# Patient Record
Sex: Female | Born: 1990 | Race: Black or African American | Hispanic: No | Marital: Single | Smoking: Never smoker
Health system: Southern US, Community
[De-identification: ages and names within clinical notes are randomized; demographics above are authoritative.]

## PROBLEM LIST (undated history)

## (undated) ENCOUNTER — Inpatient Hospital Stay (HOSPITAL_COMMUNITY): Payer: Self-pay

## (undated) DIAGNOSIS — R87629 Unspecified abnormal cytological findings in specimens from vagina: Secondary | ICD-10-CM

## (undated) DIAGNOSIS — J45909 Unspecified asthma, uncomplicated: Secondary | ICD-10-CM

## (undated) DIAGNOSIS — L309 Dermatitis, unspecified: Secondary | ICD-10-CM

## (undated) DIAGNOSIS — F419 Anxiety disorder, unspecified: Secondary | ICD-10-CM

## (undated) DIAGNOSIS — Z789 Other specified health status: Secondary | ICD-10-CM

## (undated) HISTORY — DX: Dermatitis, unspecified: L30.9

## (undated) HISTORY — PX: MOLE REMOVAL: SHX2046

## (undated) HISTORY — PX: HERNIA REPAIR: SHX51

## (undated) HISTORY — DX: Anxiety disorder, unspecified: F41.9

## (undated) HISTORY — DX: Unspecified abnormal cytological findings in specimens from vagina: R87.629

---
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
## 2011-06-30 ENCOUNTER — Inpatient Hospital Stay (INDEPENDENT_AMBULATORY_CARE_PROVIDER_SITE_OTHER)
Admission: RE | Admit: 2011-06-30 | Discharge: 2011-06-30 | Disposition: A | Discharge status: Home / Self Care | Payer: Self-pay | Source: Ambulatory Visit | Attending: Emergency Medicine | Admitting: Emergency Medicine

## 2011-06-30 DIAGNOSIS — H60399 Other infective otitis externa, unspecified ear: Secondary | ICD-10-CM

## 2011-06-30 DIAGNOSIS — J069 Acute upper respiratory infection, unspecified: Secondary | ICD-10-CM

## 2011-07-12 LAB — URINALYSIS, ROUTINE W REFLEX MICROSCOPIC
Bilirubin Urine: NEGATIVE
Glucose, UA: NEGATIVE
Ketones, ur: NEGATIVE
Nitrite: NEGATIVE
Protein, ur: NEGATIVE
Specific Gravity, Urine: 1.007
Urobilinogen, UA: 1
pH: 7.5

## 2011-07-12 LAB — URINE MICROSCOPIC-ADD ON

## 2011-07-12 LAB — POCT PREGNANCY, URINE: Preg Test, Ur: NEGATIVE

## 2012-12-02 LAB — OB RESULTS CONSOLE GBS: STREP GROUP B AG: NEGATIVE

## 2013-06-06 ENCOUNTER — Emergency Department (HOSPITAL_COMMUNITY)
Admission: EM | Admit: 2013-06-06 | Discharge: 2013-06-06 | Disposition: A | Discharge status: Home / Self Care | Payer: Medicaid Other | Attending: Emergency Medicine | Admitting: Emergency Medicine

## 2013-06-06 ENCOUNTER — Emergency Department (HOSPITAL_COMMUNITY): Payer: Medicaid Other

## 2013-06-06 ENCOUNTER — Encounter (HOSPITAL_COMMUNITY): Payer: Self-pay | Admitting: Emergency Medicine

## 2013-06-06 DIAGNOSIS — O2390 Unspecified genitourinary tract infection in pregnancy, unspecified trimester: Secondary | ICD-10-CM | POA: Insufficient documentation

## 2013-06-06 DIAGNOSIS — Z3490 Encounter for supervision of normal pregnancy, unspecified, unspecified trimester: Secondary | ICD-10-CM

## 2013-06-06 DIAGNOSIS — O9989 Other specified diseases and conditions complicating pregnancy, childbirth and the puerperium: Secondary | ICD-10-CM | POA: Insufficient documentation

## 2013-06-06 DIAGNOSIS — E876 Hypokalemia: Secondary | ICD-10-CM | POA: Insufficient documentation

## 2013-06-06 DIAGNOSIS — R110 Nausea: Secondary | ICD-10-CM | POA: Insufficient documentation

## 2013-06-06 DIAGNOSIS — O2341 Unspecified infection of urinary tract in pregnancy, first trimester: Secondary | ICD-10-CM

## 2013-06-06 DIAGNOSIS — R109 Unspecified abdominal pain: Secondary | ICD-10-CM | POA: Insufficient documentation

## 2013-06-06 DIAGNOSIS — M545 Low back pain, unspecified: Secondary | ICD-10-CM | POA: Insufficient documentation

## 2013-06-06 DIAGNOSIS — N390 Urinary tract infection, site not specified: Secondary | ICD-10-CM | POA: Insufficient documentation

## 2013-06-06 LAB — CBC WITH DIFFERENTIAL/PLATELET
Basophils Relative: 0 % (ref 0–1)
Eosinophils Absolute: 0.1 10*3/uL (ref 0.0–0.7)
Eosinophils Relative: 1 % (ref 0–5)
Lymphs Abs: 1.7 10*3/uL (ref 0.7–4.0)
MCH: 30.3 pg (ref 26.0–34.0)
MCHC: 35.1 g/dL (ref 30.0–36.0)
MCV: 86.4 fL (ref 78.0–100.0)
Neutrophils Relative %: 73 % (ref 43–77)
Platelets: 254 10*3/uL (ref 150–400)
RBC: 4.03 MIL/uL (ref 3.87–5.11)

## 2013-06-06 LAB — COMPREHENSIVE METABOLIC PANEL
Albumin: 3.5 g/dL (ref 3.5–5.2)
BUN: 6 mg/dL (ref 6–23)
Calcium: 9 mg/dL (ref 8.4–10.5)
GFR calc Af Amer: >90 mL/min (ref >90)
Glucose, Bld: 85 mg/dL (ref 70–99)
Potassium: 3 mEq/L — ABNORMAL LOW (ref 3.5–5.1)
Sodium: 134 mEq/L — ABNORMAL LOW (ref 135–145)
Total Protein: 7.3 g/dL (ref 6.0–8.3)

## 2013-06-06 LAB — URINE MICROSCOPIC-ADD ON

## 2013-06-06 LAB — URINALYSIS, ROUTINE W REFLEX MICROSCOPIC
Nitrite: NEGATIVE
Specific Gravity, Urine: 1.027 (ref 1.005–1.030)
Urobilinogen, UA: 1 mg/dL (ref 0.0–1.0)
pH: 7 (ref 5.0–8.0)

## 2013-06-06 LAB — LIPASE, BLOOD: Lipase: 38 U/L (ref 11–59)

## 2013-06-06 LAB — POCT PREGNANCY, URINE: Preg Test, Ur: POSITIVE — AB

## 2013-06-06 MED ORDER — NITROFURANTOIN MONOHYD MACRO 100 MG PO CAPS: 100.0000 mg | ORAL_CAPSULE | Freq: Two times a day (BID) | ORAL | Status: DC

## 2013-06-06 MED ORDER — POTASSIUM CHLORIDE CRYS ER 20 MEQ PO TBCR
40.0000 meq | EXTENDED_RELEASE_TABLET | Freq: Once | ORAL | Status: AC
  Administered 2013-06-06: 40 meq via ORAL
  Filled 2013-06-06: qty 2

## 2013-06-06 MED ORDER — ONDANSETRON 4 MG PO TBDP
4.0000 mg | ORAL_TABLET | Freq: Once | ORAL | Status: AC
  Administered 2013-06-06: 4 mg via ORAL
  Filled 2013-06-06: qty 1

## 2013-06-06 NOTE — ED Notes (Signed)
Pt alert and oriented x4. Respirations even and unlabored, bilateral symmetrical rise and fall of chest. Skin warm and dry. In no acute distress. Denies needs.   

## 2013-06-06 NOTE — ED Notes (Signed)
Pt to ultrasound

## 2013-06-06 NOTE — ED Notes (Signed)
Pt back from ultrasound.

## 2013-06-06 NOTE — ED Notes (Signed)
Pt escorted to discharge window. Pt verbalized understanding discharge instructions. In no acute distress.  

## 2013-06-06 NOTE — ED Provider Notes (Signed)
Medical screening examination/treatment/procedure(s) were performed by non-physician practitioner and as supervising physician I was immediately available for consultation/collaboration.   Loren Racer, MD 06/06/13 [phone]

## 2013-06-06 NOTE — ED Notes (Signed)
Pt states that she is having low abd, back pain since today at 8 am.  Pt states that she is nauseous but denies vomiting or diarrhea.

## 2013-06-06 NOTE — ED Provider Notes (Signed)
CSN: [id number]     Arrival date & time 06/06/13  1055 History     First MD Initiated Contact with Patient 06/06/13 1103     Chief Complaint  Patient presents with  . Abdominal Pain   (Consider location/radiation/quality/duration/timing/severity/associated sxs/prior Treatment) HPI Comments: Pt c/o generalized abdominal pain and lower back pain since this morning:nausea without vomiting or diarrhea:denies fever:no dysuria, has some vaginal discharge:has not taken an medication:no history of similar symptoms:last period was about 1 month ago:no previous pregnancy  The history is provided by the patient. No language interpreter was used.    History reviewed. No pertinent past medical history. History reviewed. No pertinent past surgical history. History reviewed. No pertinent family history. History  Substance Use Topics  . Smoking status: Never Smoker   . Smokeless tobacco: Not on file  . Alcohol Use: No   OB History   Grav Para Term Preterm Abortions TAB SAB Ect Mult Living                 Review of Systems  Constitutional: Negative.   Respiratory: Negative.   Cardiovascular: Negative.     Allergies  Review of patient's allergies indicates no known allergies.  Home Medications  No current outpatient prescriptions on file. BP 119/72  Pulse 95  Temp(Src) 98.3 F (36.8 C) (Oral)  Resp 16  SpO2 100%  LMP 04/22/2013 Physical Exam  Nursing note and vitals reviewed. Constitutional: She is oriented to person, place, and time. She appears well-developed and well-nourished.  HENT:  Head: Normocephalic and atraumatic.  Eyes: Conjunctivae and EOM are normal. Pupils are equal, round, and reactive to light.  Neck: Normal range of motion. Neck supple.  Cardiovascular: Normal rate and regular rhythm.   Pulmonary/Chest: Effort normal and breath sounds normal.  Abdominal: Soft. Bowel sounds are normal.  Generalized tenderness  Genitourinary:  Thick white discharge   Musculoskeletal: Normal range of motion.  Neurological: She is alert and oriented to person, place, and time.  Skin: Skin is warm and dry.  Psychiatric: She has a normal mood and affect.    ED Course   Procedures (including critical care time)  Labs Reviewed  WET PREP, GENITAL - Abnormal; Notable for the following:    Clue Cells Wet Prep HPF POC FEW (*)    WBC, Wet Prep HPF POC FEW (*)    All other components within normal limits  CBC WITH DIFFERENTIAL - Abnormal; Notable for the following:    HCT 34.8 (*)    All other components within normal limits  COMPREHENSIVE METABOLIC PANEL - Abnormal; Notable for the following:    Sodium 134 (*)    Potassium 3.0 (*)    Alkaline Phosphatase 34 (*)    All other components within normal limits  URINALYSIS, ROUTINE W REFLEX MICROSCOPIC - Abnormal; Notable for the following:    APPearance CLOUDY (*)    Leukocytes, UA MODERATE (*)    All other components within normal limits  HCG, QUANTITATIVE, PREGNANCY - Abnormal; Notable for the following:    hCG, Beta Chain, Quant, [place] [address] (*)    All other components within normal limits  POCT PREGNANCY, URINE - Abnormal; Notable for the following:    Preg Test, Ur POSITIVE (*)    All other components within normal limits  GC/CHLAMYDIA PROBE AMP  URINE CULTURE  LIPASE, BLOOD  URINE MICROSCOPIC-ADD ON   US Ob Comp Less 14 Wks  06/06/2013   *RADIOLOGY REPORT*  Clinical Data: Pregnant, abdominal pain, pelvic cramping;  no quantitative beta HCG available for comparison  OBSTETRIC <14 WK [place] AND TRANSVAGINAL OB US  Technique:  Both transabdominal and transvaginal ultrasound examinations were performed for complete evaluation of the gestation as well as the maternal uterus, adnexal regions, and pelvic cul-de-sac.  Transvaginal technique was performed to assess early pregnancy.  Comparison:  None  Intrauterine gestational sac:  Visualized/normal in shape. Yolk sac: Present Embryo: Present Cardiac Activity:  Present Heart Rate: 158  bpm  CRL: 29.8  mm  9 w  6 d             [place] EDC: 01/03/2014  Maternal uterus/adnexae: Small subchorionic hemorrhage 10 x 8 x 8 mm. Left ovary normal size and morphology, 1.9 x 3.3 x 1.8 cm. Right ovary measures 2.5 x 3.4 x 2.6 cm in size and contains a corpus luteal cyst measuring 1.8 x 1.3 x 1.3 cm. No additional adnexal masses. Trace free pelvic fluid.  IMPRESSION: Single live early intrauterine gestation measured at 9 weeks 6 days EGA. Small subchorionic hemorrhage 10 x 8 x 8 mm.   Original Report Authenticated By: Ulyses Southward, M.D.   US Ob Transvaginal  06/06/2013   *RADIOLOGY REPORT*  Clinical Data: Pregnant, abdominal pain, pelvic cramping; no quantitative beta HCG available for comparison  OBSTETRIC <14 WK [place] AND TRANSVAGINAL OB US  Technique:  Both transabdominal and transvaginal ultrasound examinations were performed for complete evaluation of the gestation as well as the maternal uterus, adnexal regions, and pelvic cul-de-sac.  Transvaginal technique was performed to assess early pregnancy.  Comparison:  None  Intrauterine gestational sac:  Visualized/normal in shape. Yolk sac: Present Embryo: Present Cardiac Activity: Present Heart Rate: 158  bpm  CRL: 29.8  mm  9 w  6 d             [place] EDC: 01/03/2014  Maternal uterus/adnexae: Small subchorionic hemorrhage 10 x 8 x 8 mm. Left ovary normal size and morphology, 1.9 x 3.3 x 1.8 cm. Right ovary measures 2.5 x 3.4 x 2.6 cm in size and contains a corpus luteal cyst measuring 1.8 x 1.3 x 1.3 cm. No additional adnexal masses. Trace free pelvic fluid.  IMPRESSION: Single live early intrauterine gestation measured at 9 weeks 6 days EGA. Small subchorionic hemorrhage 10 x 8 x 8 mm.   Original Report Authenticated By: Ulyses Southward, M.D.   1. Pregnancy   2. Hypokalemia   3. UTI (urinary tract infection) in pregnancy in first trimester     MDM  Discussed finding with pt:pt is comfortable at this time:will treat for uti:pt instructed on  uti follow up as well as prenatal care  Teressa Lower, NP 06/06/13 1427  Teressa Lower, NP 06/06/13 1427

## 2013-06-07 LAB — GC/CHLAMYDIA PROBE AMP
CT Probe RNA: POSITIVE — AB
GC Probe RNA: NEGATIVE

## 2013-06-08 LAB — URINE CULTURE

## 2013-06-08 NOTE — ED Notes (Signed)
+  Chlamydia Chart sent to EDP office for review.  

## 2013-07-12 ENCOUNTER — Other Ambulatory Visit (HOSPITAL_COMMUNITY): Payer: Self-pay | Admitting: Nurse Practitioner

## 2013-07-12 DIAGNOSIS — Z3689 Encounter for other specified antenatal screening: Secondary | ICD-10-CM

## 2013-08-01 ENCOUNTER — Encounter (HOSPITAL_COMMUNITY): Payer: Self-pay | Admitting: *Deleted

## 2013-08-01 ENCOUNTER — Inpatient Hospital Stay (HOSPITAL_COMMUNITY)
Admission: AD | Admit: 2013-08-01 | Discharge: 2013-08-01 | Disposition: A | Discharge status: Home / Self Care | Payer: Medicaid Other | Source: Ambulatory Visit | Attending: Obstetrics and Gynecology | Admitting: Obstetrics and Gynecology

## 2013-08-01 DIAGNOSIS — B9689 Other specified bacterial agents as the cause of diseases classified elsewhere: Secondary | ICD-10-CM | POA: Insufficient documentation

## 2013-08-01 DIAGNOSIS — O26899 Other specified pregnancy related conditions, unspecified trimester: Secondary | ICD-10-CM

## 2013-08-01 DIAGNOSIS — N949 Unspecified condition associated with female genital organs and menstrual cycle: Secondary | ICD-10-CM | POA: Insufficient documentation

## 2013-08-01 DIAGNOSIS — A499 Bacterial infection, unspecified: Secondary | ICD-10-CM | POA: Insufficient documentation

## 2013-08-01 DIAGNOSIS — O2390 Unspecified genitourinary tract infection in pregnancy, unspecified trimester: Secondary | ICD-10-CM | POA: Insufficient documentation

## 2013-08-01 DIAGNOSIS — N760 Acute vaginitis: Secondary | ICD-10-CM | POA: Insufficient documentation

## 2013-08-01 DIAGNOSIS — R109 Unspecified abdominal pain: Secondary | ICD-10-CM

## 2013-08-01 LAB — URINALYSIS, ROUTINE W REFLEX MICROSCOPIC
Glucose, UA: NEGATIVE mg/dL
Leukocytes, UA: NEGATIVE
Protein, ur: NEGATIVE mg/dL
Specific Gravity, Urine: >1.03 — ABNORMAL HIGH (ref 1.005–1.030)
pH: 6 (ref 5.0–8.0)

## 2013-08-01 LAB — WET PREP, GENITAL: Trich, Wet Prep: NONE SEEN

## 2013-08-01 LAB — URINE MICROSCOPIC-ADD ON

## 2013-08-01 MED ORDER — METRONIDAZOLE 500 MG PO TABS: 500.0000 mg | ORAL_TABLET | Freq: Two times a day (BID) | ORAL | Status: DC

## 2013-08-01 NOTE — MAU Note (Signed)
Pt presents with complaints of pain in her right and left lower side. States the pain started last night and she says it has gotten worse throughout the day. Denies any bleeding or leakage of fluid

## 2013-08-01 NOTE — MAU Provider Note (Signed)
History     CSN: [id number]  Arrival date and time: 08/01/13 1259   First Provider Initiated Contact with Patient 08/01/13 1331      Chief Complaint  Patient presents with  . Abdominal Pain   HPI Pt is G1P0 18w pregnant and presents with bilateral lower abdominal pain radiating upwards and to her sides. The pain is worse when she sits up but not when she moves sideways. Pt denies constipation or diarrhea or UTI symptoms.  [facility] Pt states she still has had a vaginal discharge.  She has not had IC since she was treated for chlamydia.  Pt has been seen at [facility] for her OB care but has an appointment with Dr. Gaynell Face on 10/22 for her OB care. RN note:  Registered Nurse Signed MAU Note Service date: 08/01/2013 1:21 PM   Pt presents with complaints of pain in her right and left lower side. States the pain started last night and she says it has gotten worse throughout the day. Denies any bleeding or leakage of fluid     History reviewed. No pertinent past medical history.  Past Surgical History  Procedure Laterality Date  . Hernia repair      History reviewed. No pertinent family history.  History  Substance Use Topics  . Smoking status: Never Smoker   . Smokeless tobacco: Not on file  . Alcohol Use: No    Allergies: No Known Allergies  Prescriptions prior to admission  Medication Sig Dispense Refill  . Prenatal Vit-Fe Fumarate-FA (PRENATAL MULTIVITAMIN) TABS tablet Take 1 tablet by mouth daily at 12 noon.        Review of Systems  Constitutional: Negative for fever.  Gastrointestinal: Positive for abdominal pain. Negative for nausea, vomiting, diarrhea and constipation.  Genitourinary: Negative for dysuria and urgency.   Physical Exam   Blood pressure 103/59, pulse 92, temperature 98.5 F (36.9 C), temperature source Oral, resp. rate 18, last menstrual period 03/28/2013.  Physical Exam  Vitals  reviewed. Constitutional: She is oriented to person, place, and time. She appears well-developed and well-nourished.  HENT:  Head: Normocephalic.  Eyes: Pupils are equal, round, and reactive to light.  Neck: Normal range of motion. Neck supple.  Cardiovascular: Normal rate.   Respiratory: Effort normal.  GI: Soft. She exhibits no distension. There is no tenderness. There is no rebound and no guarding.  FHR 138 bpm audible with doppler  Genitourinary:  sm-mod amount of creamy colored frothy discharge in vault; cervix closed- bimanual uterus gravid; bimanual nontender with cervical motion- no adnexal tenderness  Musculoskeletal: Normal range of motion.  Neurological: She is alert and oriented to person, place, and time.  Skin: Skin is warm and dry.  Psychiatric: She has a normal mood and affect.    MAU Course  Procedures Results for orders placed during the hospital encounter of 08/01/13 (from the past 24 hour(s))  URINALYSIS, ROUTINE W REFLEX MICROSCOPIC     Status: Abnormal   Collection Time    08/01/13  1:00 PM      Result Value Range   Color, Urine YELLOW  YELLOW   APPearance CLEAR  CLEAR   Specific Gravity, Urine >1.030 (*) 1.005 - 1.030   pH 6.0  5.0 - 8.0   Glucose, UA NEGATIVE  NEGATIVE mg/dL   Hgb urine dipstick TRACE (*) NEGATIVE   Bilirubin Urine NEGATIVE  NEGATIVE   Ketones, ur NEGATIVE  NEGATIVE mg/dL  Protein, ur NEGATIVE  NEGATIVE mg/dL   Urobilinogen, UA 1.0  0.0 - 1.0 mg/dL   Nitrite NEGATIVE  NEGATIVE   Leukocytes, UA NEGATIVE  NEGATIVE  URINE MICROSCOPIC-ADD ON     Status: Abnormal   Collection Time    08/01/13  1:00 PM      Result Value Range   Squamous Epithelial / LPF FEW (*) RARE   WBC, UA 3-6  <3 WBC/hpf   RBC / HPF 0-2  <3 RBC/hpf   Urine-Other MUCOUS PRESENT    WET PREP, GENITAL     Status: Abnormal   Collection Time    08/01/13  2:25 PM      Result Value Range   Yeast Wet Prep HPF POC NONE SEEN  NONE SEEN   Trich, Wet Prep NONE SEEN  NONE  SEEN   Clue Cells Wet Prep HPF POC MODERATE (*) NONE SEEN   WBC, Wet Prep HPF POC MODERATE (*) NONE SEEN   GC/chlamydia   Assessment and Plan  Abdominal pain in pregnancy BV- Flagyl 500mg  BID for 7 days F/u with OB appointment  Aloura Matsuoka 08/01/2013, 1:31 PM

## 2013-08-02 LAB — GC/CHLAMYDIA PROBE AMP: CT Probe RNA: NEGATIVE

## 2013-08-02 NOTE — MAU Provider Note (Signed)
Attestation of Attending Supervision of Advanced Practitioner: Evaluation and management procedures were performed by the PA/NP/CNM/OB Fellow under my supervision/collaboration. Chart reviewed and agree with management and plan.  Ausencio Vaden V 08/02/2013 10:01 AM

## 2013-08-09 ENCOUNTER — Ambulatory Visit (HOSPITAL_COMMUNITY)
Admission: RE | Admit: 2013-08-09 | Discharge: 2013-08-09 | Disposition: A | Discharge status: Home / Self Care | Payer: Medicaid Other | Source: Ambulatory Visit | Attending: Nurse Practitioner | Admitting: Nurse Practitioner

## 2013-08-09 ENCOUNTER — Ambulatory Visit (HOSPITAL_COMMUNITY): Admission: RE | Admit: 2013-08-09 | Payer: Medicaid Other | Source: Ambulatory Visit

## 2013-08-09 DIAGNOSIS — Z3689 Encounter for other specified antenatal screening: Secondary | ICD-10-CM | POA: Insufficient documentation

## 2013-08-17 LAB — OB RESULTS CONSOLE ABO/RH: RH Type: POSITIVE

## 2013-08-17 LAB — OB RESULTS CONSOLE RPR: RPR: NONREACTIVE

## 2013-08-17 LAB — OB RESULTS CONSOLE HIV ANTIBODY (ROUTINE TESTING): HIV: NONREACTIVE

## 2013-08-17 LAB — OB RESULTS CONSOLE HEPATITIS B SURFACE ANTIGEN: HEP B S AG: NEGATIVE

## 2013-08-17 LAB — OB RESULTS CONSOLE RUBELLA ANTIBODY, IGM: Rubella: IMMUNE

## 2013-08-17 LAB — OB RESULTS CONSOLE ANTIBODY SCREEN: ANTIBODY SCREEN: NEGATIVE

## 2013-10-07 ENCOUNTER — Inpatient Hospital Stay (HOSPITAL_COMMUNITY)
Admission: AD | Admit: 2013-10-07 | Discharge: 2013-10-07 | Disposition: A | Discharge status: Home / Self Care | Payer: Medicaid Other | Source: Ambulatory Visit | Attending: Obstetrics | Admitting: Obstetrics

## 2013-10-07 ENCOUNTER — Encounter (HOSPITAL_COMMUNITY): Payer: Self-pay | Admitting: *Deleted

## 2013-10-07 DIAGNOSIS — N949 Unspecified condition associated with female genital organs and menstrual cycle: Secondary | ICD-10-CM | POA: Insufficient documentation

## 2013-10-07 DIAGNOSIS — B3731 Acute candidiasis of vulva and vagina: Secondary | ICD-10-CM | POA: Insufficient documentation

## 2013-10-07 DIAGNOSIS — R109 Unspecified abdominal pain: Secondary | ICD-10-CM | POA: Insufficient documentation

## 2013-10-07 DIAGNOSIS — B373 Candidiasis of vulva and vagina: Secondary | ICD-10-CM | POA: Insufficient documentation

## 2013-10-07 DIAGNOSIS — N898 Other specified noninflammatory disorders of vagina: Secondary | ICD-10-CM | POA: Insufficient documentation

## 2013-10-07 DIAGNOSIS — M549 Dorsalgia, unspecified: Secondary | ICD-10-CM | POA: Insufficient documentation

## 2013-10-07 HISTORY — DX: Other specified health status: Z78.9

## 2013-10-07 LAB — URINALYSIS, ROUTINE W REFLEX MICROSCOPIC
Glucose, UA: NEGATIVE mg/dL
Hgb urine dipstick: NEGATIVE
Protein, ur: NEGATIVE mg/dL
Specific Gravity, Urine: 1.02 (ref 1.005–1.030)
Urobilinogen, UA: 0.2 mg/dL (ref 0.0–1.0)
pH: 8 (ref 5.0–8.0)

## 2013-10-07 LAB — URINE MICROSCOPIC-ADD ON

## 2013-10-07 LAB — WET PREP, GENITAL
Clue Cells Wet Prep HPF POC: NONE SEEN
Trich, Wet Prep: NONE SEEN

## 2013-10-07 MED ORDER — PROMETHAZINE HCL 25 MG PO TABS
25.0000 mg | ORAL_TABLET | Freq: Once | ORAL | Status: AC
  Administered 2013-10-07: 25 mg via ORAL
  Filled 2013-10-07: qty 1

## 2013-10-07 MED ORDER — OXYCODONE-ACETAMINOPHEN 5-325 MG PO TABS
1.0000 | ORAL_TABLET | Freq: Once | ORAL | Status: AC
  Administered 2013-10-07: 1 via ORAL
  Filled 2013-10-07: qty 1

## 2013-10-07 MED ORDER — FLUCONAZOLE 150 MG PO TABS: 150.0000 mg | ORAL_TABLET | Freq: Every day | ORAL | Status: DC

## 2013-10-07 MED ORDER — PROMETHAZINE HCL 25 MG PO TABS: 25.0000 mg | ORAL_TABLET | Freq: Four times a day (QID) | ORAL | Status: DC | PRN

## 2013-10-07 MED ORDER — ACETAMINOPHEN-CODEINE #3 300-30 MG PO TABS: 1.0000 | ORAL_TABLET | Freq: Four times a day (QID) | ORAL | Status: DC | PRN

## 2013-10-07 NOTE — MAU Provider Note (Signed)
History     CSN: [id number]  Arrival date and time: 10/07/13 1353   None     Chief Complaint  Patient presents with  . Pelvic Pain  . Back Pain   HPI Comments: Misty Curry 21 y.o. G1P0 presents to MAU with low pelvic pains that started today and back pains that have been going off and off since the pregnancy started. She is 27 weeks and 4 days. She took tylenol this morning. She is patient of Dr Gaynell Face. She is also complaining of a vaginal discharge. No change in sexual partner  Pelvic Pain The patient's primary symptoms include pelvic pain. Associated symptoms include back pain.  Back Pain Associated symptoms include pelvic pain.      Past Medical History  Diagnosis Date  . Medical history non-contributory     Past Surgical History  Procedure Laterality Date  . Hernia repair    . Mole removal      Family History  Problem Relation Age of Onset  . Alcohol abuse Neg Hx   . Asthma Neg Hx   . Arthritis Neg Hx   . Birth defects Neg Hx   . Cancer Neg Hx   . COPD Neg Hx   . Depression Neg Hx   . Diabetes Neg Hx   . Drug abuse Neg Hx   . Early death Neg Hx   . Hearing loss Neg Hx   . Heart disease Neg Hx   . Hyperlipidemia Neg Hx   . Hypertension Neg Hx   . Kidney disease Neg Hx   . Learning disabilities Neg Hx   . Mental illness Neg Hx   . Mental retardation Neg Hx   . Miscarriages / Stillbirths Neg Hx   . Stroke Neg Hx   . Vision loss Neg Hx   . Varicose Veins Neg Hx     History  Substance Use Topics  . Smoking status: Never Smoker   . Smokeless tobacco: Not on file  . Alcohol Use: No    Allergies: No Known Allergies  Prescriptions prior to admission  Medication Sig Dispense Refill  . IRON PO Take 1 tablet by mouth 2 (two) times daily.      . Prenatal Vit-Fe Fumarate-FA (PRENATAL MULTIVITAMIN) TABS tablet Take 1 tablet by mouth daily at 12 noon.        Review of Systems  Constitutional: Negative.   HENT: Negative.   Eyes: Negative.    Respiratory: Negative.   Cardiovascular: Negative.   Genitourinary: Positive for pelvic pain.       Low pelvic discomfort  Musculoskeletal: Positive for back pain.  Skin: Negative.   Neurological: Negative.   Psychiatric/Behavioral: Negative.    Physical Exam   Blood pressure 122/75, pulse 100, temperature 98.5 F (36.9 C), temperature source Oral, resp. rate 18, height 5' 4.5" (1.638 m), weight 80.287 kg (177 lb), last menstrual period 03/28/2013.  Physical Exam  Constitutional: She is oriented to person, place, and time. She appears well-developed and well-nourished. No distress.  HENT:  Head: Normocephalic and atraumatic.  Eyes: Pupils are equal, round, and reactive to light.  GI: Soft. She exhibits no distension and no mass. There is no tenderness. There is no rebound and no guarding.  Genitourinary:  Genitalia: external/ negative Vaginal: thick, chunky discharge Cervix: long and closed Biman: negative  Musculoskeletal: Normal range of motion.  Neurological: She is alert and oriented to person, place, and time.  Skin: Skin is warm and dry.  Psychiatric: She  has a normal mood and affect. Her behavior is normal. Judgment and thought content normal.   Results for orders placed during the hospital encounter of 10/07/13 (from the past 24 hour(s))  URINALYSIS, ROUTINE W REFLEX MICROSCOPIC     Status: Abnormal   Collection Time    10/07/13  2:02 PM      Result Value Range   Color, Urine YELLOW  YELLOW   APPearance CLEAR  CLEAR   Specific Gravity, Urine 1.020  1.005 - 1.030   pH 8.0  5.0 - 8.0   Glucose, UA NEGATIVE  NEGATIVE mg/dL   Hgb urine dipstick NEGATIVE  NEGATIVE   Bilirubin Urine NEGATIVE  NEGATIVE   Ketones, ur NEGATIVE  NEGATIVE mg/dL   Protein, ur NEGATIVE  NEGATIVE mg/dL   Urobilinogen, UA 0.2  0.0 - 1.0 mg/dL   Nitrite NEGATIVE  NEGATIVE   Leukocytes, UA MODERATE (*) NEGATIVE  URINE MICROSCOPIC-ADD ON     Status: Abnormal   Collection Time    10/07/13   2:02 PM      Result Value Range   Squamous Epithelial / LPF FEW (*) RARE   WBC, UA 3-6  <3 WBC/hpf   RBC / HPF 0-2  <3 RBC/hpf   Bacteria, UA FEW (*) RARE  WET PREP, GENITAL     Status: Abnormal   Collection Time    10/07/13  2:35 PM      Result Value Range   Yeast Wet Prep HPF POC MODERATE (*) NONE SEEN   Trich, Wet Prep NONE SEEN  NONE SEEN   Clue Cells Wet Prep HPF POC NONE SEEN  NONE SEEN   WBC, Wet Prep HPF POC MANY (*) NONE SEEN     MAU Course  Procedures  MDM  Percocet/ phenergan for pain Wet prep/ GC/Chlamydia  Assessment and Plan   A: Round Ligament pains Vaginal Yeast Infection  P: Discussed maternity belts, warm soaks Tylenol # 3 q 4 hours prn Diflucan 150 mg po Follow up with [name], [name] 10/07/2013, 2:39 PM

## 2013-10-07 NOTE — MAU Note (Signed)
C/o intermittent lower back pain since August; c/o sharp shooting pain started today around 1200;

## 2013-10-07 NOTE — MAU Note (Signed)
Pelvic pain, radiates around to back; comes and goes- crampy. Pain started this morning is getting worse.

## 2013-10-08 LAB — URINE CULTURE

## 2013-10-08 LAB — GC/CHLAMYDIA PROBE AMP
CT Probe RNA: NEGATIVE
GC Probe RNA: NEGATIVE

## 2013-10-14 NOTE — L&D Delivery Note (Signed)
Delivery Note At 8:48 AM a viable female was delivered via Vaginal, Spontaneous Delivery (Presentation: ;  ).  APGAR: , ; weight .   Placenta status: Intact, Spontaneous.  Cord: 3 vessels with the following complications: None.  Cord pH: not done  Anesthesia: Epidural  Episiotomy: None Lacerations: 1st degree;Sulcus Suture Repair: 2.0 vicryl Est. Blood Loss (mL): 250  Mom to postpartum.  Baby to Couplet care / Skin to Skin.  MARSHALL,BERNARD A 12/31/2013, 9:10 AM

## 2013-12-02 LAB — OB RESULTS CONSOLE GC/CHLAMYDIA
CHLAMYDIA, DNA PROBE: NEGATIVE
GC PROBE AMP, GENITAL: NEGATIVE

## 2013-12-02 LAB — OB RESULTS CONSOLE GBS: GBS: NEGATIVE

## 2013-12-15 ENCOUNTER — Encounter (HOSPITAL_COMMUNITY): Payer: Self-pay | Admitting: *Deleted

## 2013-12-15 ENCOUNTER — Inpatient Hospital Stay (HOSPITAL_COMMUNITY)
Admission: AD | Admit: 2013-12-15 | Discharge: 2013-12-15 | Disposition: A | Discharge status: Home / Self Care | Payer: Medicaid Other | Source: Ambulatory Visit | Attending: Obstetrics | Admitting: Obstetrics

## 2013-12-15 DIAGNOSIS — O479 False labor, unspecified: Secondary | ICD-10-CM | POA: Insufficient documentation

## 2013-12-15 MED ORDER — ZOLPIDEM TARTRATE 5 MG PO TABS
5.0000 mg | ORAL_TABLET | Freq: Once | ORAL | Status: AC
  Administered 2013-12-15: 5 mg via ORAL
  Filled 2013-12-15: qty 1

## 2013-12-15 NOTE — Treatment Plan (Signed)
Dr Gaynell FaceMarshall notfied of patients complaints, gestational age, ctx pattern and cervical exam.  Orders to d/c pt home.  Pt may have Ambien 5 mg PO prior to d/c

## 2013-12-15 NOTE — MAU Note (Signed)
Contractions for past hour q15 minutes. Denies leaking fluid.

## 2013-12-30 ENCOUNTER — Inpatient Hospital Stay (HOSPITAL_COMMUNITY)
Admission: AD | Admit: 2013-12-30 | Discharge: 2014-01-01 | DRG: 775 | Disposition: A | Discharge status: Home / Self Care | Payer: Medicaid Other | Source: Ambulatory Visit | Attending: Obstetrics | Admitting: Obstetrics

## 2013-12-30 ENCOUNTER — Encounter (HOSPITAL_COMMUNITY): Payer: Self-pay | Admitting: *Deleted

## 2013-12-30 DIAGNOSIS — IMO0001 Reserved for inherently not codable concepts without codable children: Secondary | ICD-10-CM

## 2013-12-30 MED ORDER — ONDANSETRON HCL 4 MG/2ML IJ SOLN: 4.0000 mg | Freq: Four times a day (QID) | INTRAMUSCULAR | Status: DC | PRN

## 2013-12-30 MED ORDER — ACETAMINOPHEN 325 MG PO TABS: 650.0000 mg | ORAL_TABLET | ORAL | Status: DC | PRN

## 2013-12-30 MED ORDER — FLEET ENEMA 7-19 GM/118ML RE ENEM: 1.0000 | ENEMA | RECTAL | Status: DC | PRN

## 2013-12-30 MED ORDER — LACTATED RINGERS IV SOLN
INTRAVENOUS | Status: DC
  Administered 2013-12-31: via INTRAVENOUS

## 2013-12-30 MED ORDER — LIDOCAINE HCL (PF) 1 % IJ SOLN
30.0000 mL | INTRAMUSCULAR | Status: DC | PRN
  Administered 2013-12-31: 30 mL via SUBCUTANEOUS
  Filled 2013-12-30: qty 30

## 2013-12-30 MED ORDER — CITRIC ACID-SODIUM CITRATE 334-500 MG/5ML PO SOLN: 30.0000 mL | ORAL | Status: DC | PRN

## 2013-12-30 MED ORDER — OXYCODONE-ACETAMINOPHEN 5-325 MG PO TABS: 1.0000 | ORAL_TABLET | ORAL | Status: DC | PRN

## 2013-12-30 MED ORDER — OXYTOCIN 40 UNITS IN LACTATED RINGERS INFUSION - SIMPLE MED
62.5000 mL/h | INTRAVENOUS | Status: DC
  Administered 2013-12-31: 62.5 mL/h via INTRAVENOUS
  Filled 2013-12-30: qty 1000

## 2013-12-30 MED ORDER — IBUPROFEN 600 MG PO TABS
600.0000 mg | ORAL_TABLET | Freq: Four times a day (QID) | ORAL | Status: DC | PRN
  Administered 2013-12-31: 600 mg via ORAL

## 2013-12-30 MED ORDER — LACTATED RINGERS IV SOLN: 500.0000 mL | INTRAVENOUS | Status: DC | PRN

## 2013-12-30 MED ORDER — BUTORPHANOL TARTRATE 1 MG/ML IJ SOLN
1.0000 mg | INTRAMUSCULAR | Status: DC | PRN
  Administered 2013-12-31 (×3): 1 mg via INTRAVENOUS
  Filled 2013-12-30 (×3): qty 1

## 2013-12-30 MED ORDER — OXYTOCIN BOLUS FROM INFUSION: 500.0000 mL | INTRAVENOUS | Status: DC

## 2013-12-30 NOTE — MAU Note (Signed)
C/o ucs since 1600 and ?SROM @ 2100;

## 2013-12-31 ENCOUNTER — Encounter (HOSPITAL_COMMUNITY): Payer: Self-pay | Admitting: *Deleted

## 2013-12-31 ENCOUNTER — Inpatient Hospital Stay (HOSPITAL_COMMUNITY): Payer: Medicaid Other | Admitting: Anesthesiology

## 2013-12-31 ENCOUNTER — Encounter (HOSPITAL_COMMUNITY): Payer: Medicaid Other | Admitting: Anesthesiology

## 2013-12-31 LAB — CBC
HCT: 32.4 % — ABNORMAL LOW (ref 36.0–46.0)
Hemoglobin: 11.3 g/dL — ABNORMAL LOW (ref 12.0–15.0)
MCH: 31.2 pg (ref 26.0–34.0)
MCHC: 34.9 g/dL (ref 30.0–36.0)
MCV: 89.5 fL (ref 78.0–100.0)
Platelets: 181 10*3/uL (ref 150–400)
RBC: 3.62 MIL/uL — ABNORMAL LOW (ref 3.87–5.11)
RDW: 12.6 % (ref 11.5–15.5)
WBC: 11.2 10*3/uL — ABNORMAL HIGH (ref 4.0–10.5)

## 2013-12-31 LAB — RPR: RPR: NONREACTIVE

## 2013-12-31 MED ORDER — EPHEDRINE 5 MG/ML INJ
10.0000 mg | INTRAVENOUS | Status: DC | PRN
  Filled 2013-12-31: qty 4
  Filled 2013-12-31: qty 2

## 2013-12-31 MED ORDER — LIDOCAINE HCL (PF) 1 % IJ SOLN
INTRAMUSCULAR | Status: DC | PRN
  Administered 2013-12-31 (×2): 5 mL

## 2013-12-31 MED ORDER — DIPHENHYDRAMINE HCL 50 MG/ML IJ SOLN: 12.5000 mg | INTRAMUSCULAR | Status: DC | PRN

## 2013-12-31 MED ORDER — LACTATED RINGERS IV SOLN
500.0000 mL | Freq: Once | INTRAVENOUS | Status: AC
  Administered 2013-12-31: 500 mL via INTRAVENOUS

## 2013-12-31 MED ORDER — EPHEDRINE 5 MG/ML INJ
10.0000 mg | INTRAVENOUS | Status: DC | PRN
  Filled 2013-12-31: qty 2

## 2013-12-31 MED ORDER — PHENYLEPHRINE 40 MCG/ML (10ML) SYRINGE FOR IV PUSH (FOR BLOOD PRESSURE SUPPORT)
80.0000 ug | PREFILLED_SYRINGE | INTRAVENOUS | Status: DC | PRN
  Filled 2013-12-31: qty 2
  Filled 2013-12-31: qty 10

## 2013-12-31 MED ORDER — WITCH HAZEL-GLYCERIN EX PADS: 1.0000 "application " | MEDICATED_PAD | CUTANEOUS | Status: DC | PRN

## 2013-12-31 MED ORDER — PRENATAL MULTIVITAMIN CH
1.0000 | ORAL_TABLET | Freq: Every day | ORAL | Status: DC
  Administered 2013-12-31 – 2014-01-01 (×2): 1 via ORAL
  Filled 2013-12-31 (×2): qty 1

## 2013-12-31 MED ORDER — DIPHENHYDRAMINE HCL 25 MG PO CAPS: 25.0000 mg | ORAL_CAPSULE | Freq: Four times a day (QID) | ORAL | Status: DC | PRN

## 2013-12-31 MED ORDER — LANOLIN HYDROUS EX OINT: TOPICAL_OINTMENT | CUTANEOUS | Status: DC | PRN

## 2013-12-31 MED ORDER — DIBUCAINE 1 % RE OINT: 1.0000 "application " | TOPICAL_OINTMENT | RECTAL | Status: DC | PRN

## 2013-12-31 MED ORDER — PHENYLEPHRINE 40 MCG/ML (10ML) SYRINGE FOR IV PUSH (FOR BLOOD PRESSURE SUPPORT)
80.0000 ug | PREFILLED_SYRINGE | INTRAVENOUS | Status: DC | PRN
  Filled 2013-12-31: qty 2

## 2013-12-31 MED ORDER — TETANUS-DIPHTH-ACELL PERTUSSIS 5-2.5-18.5 LF-MCG/0.5 IM SUSP: 0.5000 mL | Freq: Once | INTRAMUSCULAR | Status: DC

## 2013-12-31 MED ORDER — SIMETHICONE 80 MG PO CHEW: 80.0000 mg | CHEWABLE_TABLET | ORAL | Status: DC | PRN

## 2013-12-31 MED ORDER — OXYCODONE-ACETAMINOPHEN 5-325 MG PO TABS
1.0000 | ORAL_TABLET | ORAL | Status: DC | PRN
  Administered 2014-01-01: 1 via ORAL
  Filled 2013-12-31: qty 1

## 2013-12-31 MED ORDER — ZOLPIDEM TARTRATE 5 MG PO TABS: 5.0000 mg | ORAL_TABLET | Freq: Every evening | ORAL | Status: DC | PRN

## 2013-12-31 MED ORDER — FENTANYL 2.5 MCG/ML BUPIVACAINE 1/10 % EPIDURAL INFUSION (WH - ANES)
14.0000 mL/h | INTRAMUSCULAR | Status: DC | PRN
  Administered 2013-12-31: 14 mL/h via EPIDURAL
  Filled 2013-12-31: qty 125

## 2013-12-31 MED ORDER — IBUPROFEN 600 MG PO TABS
600.0000 mg | ORAL_TABLET | Freq: Four times a day (QID) | ORAL | Status: DC
  Administered 2013-12-31 – 2014-01-01 (×5): 600 mg via ORAL
  Filled 2013-12-31 (×6): qty 1

## 2013-12-31 MED ORDER — ONDANSETRON HCL 4 MG PO TABS: 4.0000 mg | ORAL_TABLET | ORAL | Status: DC | PRN

## 2013-12-31 MED ORDER — BENZOCAINE-MENTHOL 20-0.5 % EX AERO
1.0000 "application " | INHALATION_SPRAY | CUTANEOUS | Status: DC | PRN
  Administered 2013-12-31: 1 via TOPICAL
  Filled 2013-12-31: qty 56

## 2013-12-31 MED ORDER — FERROUS SULFATE 325 (65 FE) MG PO TABS
325.0000 mg | ORAL_TABLET | Freq: Two times a day (BID) | ORAL | Status: DC
  Administered 2013-12-31 – 2014-01-01 (×3): 325 mg via ORAL
  Filled 2013-12-31 (×3): qty 1

## 2013-12-31 MED ORDER — ONDANSETRON HCL 4 MG/2ML IJ SOLN: 4.0000 mg | INTRAMUSCULAR | Status: DC | PRN

## 2013-12-31 MED ORDER — SENNOSIDES-DOCUSATE SODIUM 8.6-50 MG PO TABS
2.0000 | ORAL_TABLET | ORAL | Status: DC
  Administered 2014-01-01: 2 via ORAL
  Filled 2013-12-31: qty 2

## 2013-12-31 NOTE — Progress Notes (Signed)
UR completed 

## 2013-12-31 NOTE — Anesthesia Preprocedure Evaluation (Signed)

## 2013-12-31 NOTE — H&P (Signed)
This is Dr. Francoise CeoBernard Marshall dictating the history and physical o Misty Curry she's a 23 year old gravida 1 at 7239 weeks and 4 days EDC 01/03/1948 negative GBS membranes ruptured 9 PM last night she's in labor 4 cm 80% vertex -1 station fluids clear negative GBS and contracting irregularly Past medical history negative Past surgical history negative Social history negative System review negative Physical exam well-developed female in labor HEENT negative Lungs clear to P&A Heart regular rhythm no murmurs no gallops Breasts negative Abdomen term Pelvic as described above Extremities negative&

## 2013-12-31 NOTE — Anesthesia Postprocedure Evaluation (Signed)
  Anesthesia Post-op Note  Anesthesia Post Note  Patient: Misty Curry  Procedure(s) Performed: * No procedures listed *  Anesthesia type: Epidural  Patient location: Mother/Baby  Post pain: Pain level controlled  Post assessment: Post-op Vital signs reviewed  Last Vitals:  Filed Vitals:   12/31/13 1145  BP: 126/82  Pulse: 93  Temp: 36.8 C  Resp: 18    Post vital signs: Reviewed  Level of consciousness:alert  Complications: No apparent anesthesia complications

## 2013-12-31 NOTE — Anesthesia Procedure Notes (Signed)
Epidural Patient location during procedure: OB Start time: 12/31/2013 7:28 AM  Staffing Anesthesiologist: Brayton CavesJACKSON, Aitanna Haubner Performed by: anesthesiologist   Preanesthetic Checklist Completed: patient identified, site marked, surgical consent, pre-op evaluation, timeout performed, IV checked, risks and benefits discussed and monitors and equipment checked  Epidural Patient position: sitting Prep: site prepped and draped and DuraPrep Patient monitoring: continuous pulse ox and blood pressure Approach: midline Injection technique: LOR air  Needle:  Needle type: Tuohy  Needle gauge: 17 G Needle length: 9 cm and 9 Needle insertion depth: 7 cm Catheter type: closed end flexible Catheter size: 19 Gauge Catheter at skin depth: 12 cm Test dose: negative  Assessment Events: blood not aspirated, injection not painful, no injection resistance, negative IV test and no paresthesia  Additional Notes Patient identified.  Risk benefits discussed including failed block, incomplete pain control, headache, nerve damage, paralysis, blood pressure changes, nausea, vomiting, reactions to medication both toxic or allergic, and postpartum back pain.  Patient expressed understanding and wished to proceed.  All questions were answered.  Sterile technique used throughout procedure and epidural site dressed with sterile barrier dressing. No paresthesia or other complications noted.The patient did not experience any signs of intravascular injection such as tinnitus or metallic taste in mouth nor signs of intrathecal spread such as rapid motor block. Please see nursing notes for vital signs.

## 2014-01-01 LAB — CBC
HCT: 29.9 % — ABNORMAL LOW (ref 36.0–46.0)
Hemoglobin: 10.3 g/dL — ABNORMAL LOW (ref 12.0–15.0)
MCH: 31.2 pg (ref 26.0–34.0)
MCHC: 34.4 g/dL (ref 30.0–36.0)
MCV: 90.6 fL (ref 78.0–100.0)
Platelets: 166 10*3/uL (ref 150–400)
RBC: 3.3 MIL/uL — ABNORMAL LOW (ref 3.87–5.11)
RDW: 12.7 % (ref 11.5–15.5)
WBC: 10.9 10*3/uL — ABNORMAL HIGH (ref 4.0–10.5)

## 2014-01-01 MED ORDER — IRON 18 MG PO TBCR: 1.0000 | EXTENDED_RELEASE_TABLET | Freq: Every morning | ORAL | Status: DC

## 2014-01-01 MED ORDER — OXYCODONE-ACETAMINOPHEN 5-325 MG PO TABS: 1.0000 | ORAL_TABLET | ORAL | Status: DC | PRN

## 2014-01-01 MED ORDER — PRENATAL MULTIVITAMIN CH: 1.0000 | ORAL_TABLET | Freq: Every day | ORAL | Status: DC

## 2014-01-01 NOTE — Discharge Instructions (Signed)
Vaginal Delivery Care After Refer to this sheet in the next few weeks. These discharge instructions provide you with information on caring for yourself after delivery. Your caregiver may also give you specific instructions. Your treatment has been planned according to the most current medical practices available, but problems sometimes occur. Call your caregiver if you have any problems or questions after you go home. HOME CARE INSTRUCTIONS  Take over-the-counter or prescription medicines only as directed by your caregiver or pharmacist.  Do not drink alcohol, especially if you are breastfeeding or taking medicine to relieve pain.  Do not chew or smoke tobacco.  Do not use illegal drugs.  Continue to use good perineal care. Good perineal care includes:  Wiping your perineum from front to back.  Keeping your perineum clean.  Do not use tampons or douche until your caregiver says it is okay.  Shower, wash your hair, and take tub baths as directed by your caregiver.  Wear a well-fitting bra that provides breast support.  Eat healthy foods.  Drink enough fluids to keep your urine clear or pale yellow.  Eat high-fiber foods such as whole grain cereals and breads, brown rice, beans, and fresh fruits and vegetables every day. These foods may help prevent or relieve constipation.  Follow your cargiver's recommendations regarding resumption of activities such as climbing stairs, driving, lifting, exercising, or traveling.  Talk to your caregiver about resuming sexual activities. Resumption of sexual activities is dependent upon your risk of infection, your rate of healing, and your comfort and desire to resume sexual activity.  Try to have someone help you with your household activities and your newborn for at least a few days after you leave the hospital.  Rest as much as possible. Try to rest or take a nap when your newborn is sleeping.  Increase your activities gradually.  Keep  all of your scheduled postpartum appointments. It is very important to keep your scheduled follow-up appointments. At these appointments, your caregiver will be checking to make sure that you are healing physically and emotionally. SEEK MEDICAL CARE IF:   You are passing large clots from your vagina. Save any clots to show your caregiver.  You have a foul smelling discharge from your vagina.  You have trouble urinating.  You are urinating frequently.  You have pain when you urinate.  You have a change in your bowel movements.  You have increasing redness, pain, or swelling near your vaginal incision (episiotomy) or vaginal tear.  You have pus draining from your episiotomy or vaginal tear.  Your episiotomy or vaginal tear is separating.  You have painful, hard, or reddened breasts.  You have a severe headache.  You have blurred vision or see spots.  You feel sad or depressed.  You have thoughts of hurting yourself or your newborn.  You have questions about your care, the care of your newborn, or medicines.  You are dizzy or lightheaded.  You have a rash.  You have nausea or vomiting.  You were breastfeeding and have not had a menstrual period within 12 weeks after you stopped breastfeeding.  You are not breastfeeding and have not had a menstrual period by the 12th week after delivery.  You have a fever. SEEK IMMEDIATE MEDICAL CARE IF:   You have persistent pain.  You have chest pain.  You have shortness of breath.  You faint.  You have leg pain.  You have stomach pain.  Your vaginal bleeding saturates two or more sanitary pads  in 1 hour. MAKE SURE YOU:   Understand these instructions.  Will watch your condition.  Will get help right away if you are not doing well or get worse. Document Released: 09/27/2000 Document Revised: 06/24/2012 Document Reviewed: 05/27/2012 [facility] Patient Information 2014 [place], [place].  Breast Pumping Tips Pumping your  breast milk is a good way to stimulate milk production and have a steady supply of breast milk for your infant. Pumping is most helpful during your infant's growth spurts, when involving dad or a family member, or when you are away. There are several types of pumps available. They can be purchased at a baby or maternity store. You can begin pumping soon after delivery, but some experts believe that you should wait about four weeks to give your infant a bottle. In general, the more you breastfeed or pump, the more milk you will have for your infant. It is also important to take good care of yourself. This will reduce stress and help your body to create a healthy supply of milk. Your caregiver or lactation consultant can give you the information and support you need in your efforts to breastfeed your infant. PUMPING BREAST MILK  Follow the tips below for successful breast pumping. Take care of yourself.  Drink enough water or fluids to keep urine clear or pale yellow. You may notice a thirsty feeling while breastfeeding. This is because your body needs more water to make breast milk. Keep a large water bottle handy. Make healthy drink choices such as unsweetened fruit juice, milk and water. Limit soda, coffee, and alcohol (wait 2 hours to feed or pump if you have an alcoholic drink.)  Eat a healthy, well-balanced diet rich in fruits, vegetables, and whole grains.  Exercise as recommended by your caregiver.  Get plenty of sleep. Sleep when your infant sleeps. Ask friends and family for help if you need time to nap or rest.  Do not smoke. Smoking can lower your milk supply and harm your infant. If you need help quitting, ask your caregiver for a program recommendation.  Ask your caregiver about birth control options. Birth control pills may lower your milk supply. You may be advised to use condoms or other forms of birth control. Relax and pump Stimulating your let-down reflex is the key to successful  and effective pumping. This makes the milk in all parts of the breast flow more freely.   It is easier to pump breast milk (and breastfeed) while you are relaxed. Find techniques that work for you. Quiet private spaces, breast massage, soothing heat placed on the breast, music, and pictures or a tape recording of your infant may help you to relax and "let down" your milk. If you have difficulty with your let down, try smelling one of your infant's blankets or an item of clothing he or she has worn while you are pumping.  When pumping, place the special suction cup (flange) directly over the nipple. It may be uncomfortable and cause nipple damage if it is not placed properly or is the wrong size. Applying a small amount of purified or modified lanolin to your nipple and the areola may help increase your comfort level. Also, you can change the speed and suction of many electric pumps to your comfort level. Your caregiver or lactation consultant can help you with this.  If pumping continues to be painful, or you feel you are not getting very much milk when you pump, you may need a different type of pump.  A lactation consultant can help you determine if this is the case.  If you are with your infant, feed him or her on demand and try pumping after each feeding. This will boost your production, even if milk does not come out. You may not be able to pump much milk at first, but keep up the routine, and this will change.  If you are working or away from your infant for several hours, try pumping for about 15 minutes every 2 to 3 hours. Pump both breasts at the same time if you can.  If your infant has a formula feeding, make sure you pump your milk around the same time to maintain your supply.  Begin pumping breast milk a few weeks before you return to work. This will help you develop techniques that work for you and will be able to store extra milk.  Find a source of breastfeeding information that works  well for you. TIPS FOR STORING BREAST MILK  Store breast milk in a sealable sterile bag, jar, or container provided with your pumping supplies.  Store milk in small amounts close to what your infant is drinking at each feeding.  Cool pumped milk in a refrigerator or cooler. Pumped milk can last at the back of the refrigerator for 3 to 8 days.  Place cooled milk at the back of the freezer for up to 3 months.  Thaw the milk in its container or bag in warm water up to 24 hours in advance. Do not use a microwave to thaw or heat milk. Do not refreeze the milk after it has been thawed.  Breast milk is safe to drink when left at room temperature (mid 70s or colder) for 4 to 8 hours. After that, throw it away.  Milk fat can separate and look funny. The color can vary slightly from day to day. This is normal. Always shake the milk before using it to mix the fat with the more watery portion. SEEK MEDICAL CARE IF:   You are having trouble pumping or feeding your infant.  You are concerned that you are not making enough milk.  You have nipple pain, soreness, or redness.  You have other questions or concerns related to you or your infant. Document Released: 03/20/2010 Document Revised: 12/23/2011 Document Reviewed: 03/20/2010 [facility] Patient Information 2014 [place], [place]. Breastfeeding Deciding to breastfeed is one of the best choices you can make for you and your baby. A change in hormones during pregnancy causes your breast tissue to grow and increases the number and size of your milk ducts. These hormones also allow proteins, sugars, and fats from your blood supply to make breast milk in your milk-producing glands. Hormones prevent breast milk from being released before your baby is born as well as prompt milk flow after birth. Once breastfeeding has begun, thoughts of your baby, as well as his or her sucking or crying, can stimulate the release of milk from your milk-producing glands.    BENEFITS OF BREASTFEEDING For Your Baby  Your first milk (colostrum) helps your baby's digestive system function better.   There are antibodies in your milk that help your baby fight off infections.   Your baby has a lower incidence of asthma, allergies, and sudden infant death syndrome.   The nutrients in breast milk are better for your baby than infant formulas and are designed uniquely for your baby's needs.   Breast milk improves your baby's brain development.   Your baby is less likely  to develop other conditions, such as childhood obesity, asthma, or type 2 diabetes mellitus.  For You   Breastfeeding helps to create a very special bond between you and your baby.   Breastfeeding is convenient. Breast milk is always available at the correct temperature and costs nothing.   Breastfeeding helps to burn calories and helps you lose the weight gained during pregnancy.   Breastfeeding makes your uterus contract to its prepregnancy size faster and slows bleeding (lochia) after you give birth.   Breastfeeding helps to lower your risk of developing type 2 diabetes mellitus, osteoporosis, and breast or ovarian cancer later in life. SIGNS THAT YOUR BABY IS HUNGRY Early Signs of Hunger  Increased alertness or activity.  Stretching.  Movement of the head from side to side.  Movement of the head and opening of the mouth when the corner of the mouth or cheek is stroked (rooting).  Increased sucking sounds, smacking lips, cooing, sighing, or squeaking.  Hand-to-mouth movements.  Increased sucking of fingers or hands. Late Signs of Hunger  Fussing.  Intermittent crying. Extreme Signs of Hunger Signs of extreme hunger will require calming and consoling before your baby will be able to breastfeed successfully. Do not wait for the following signs of extreme hunger to occur before you initiate breastfeeding:   Restlessness.  A loud, strong cry.    Screaming. BREASTFEEDING BASICS Breastfeeding Initiation  Find a comfortable place to sit or lie down, with your neck and back well supported.  Place a pillow or rolled up blanket under your baby to bring him or her to the level of your breast (if you are seated). Nursing pillows are specially designed to help support your arms and your baby while you breastfeed.  Make sure that your baby's abdomen is facing your abdomen.   Gently massage your breast. With your fingertips, massage from your chest wall toward your nipple in a circular motion. This encourages milk flow. You may need to continue this action during the feeding if your milk flows slowly.  Support your breast with 4 fingers underneath and your thumb above your nipple. Make sure your fingers are well away from your nipple and your baby's mouth.   Stroke your baby's lips gently with your finger or nipple.   When your baby's mouth is open wide enough, quickly bring your baby to your breast, placing your entire nipple and as much of the colored area around your nipple (areola) as possible into your baby's mouth.   More areola should be visible above your baby's upper lip than below the lower lip.   Your baby's tongue should be between his or her lower gum and your breast.   Ensure that your baby's mouth is correctly positioned around your nipple (latched). Your baby's lips should create a seal on your breast and be turned out (everted).  It is common for your baby to suck about 2 3 minutes in order to start the flow of breast milk. Latching Teaching your baby how to latch on to your breast properly is very important. An improper latch can cause nipple pain and decreased milk supply for you and poor weight gain in your baby. Also, if your baby is not latched onto your nipple properly, he or she may swallow some air during feeding. This can make your baby fussy. Burping your baby when you switch breasts during the feeding can  help to get rid of the air. However, teaching your baby to latch on properly is still  the best way to prevent fussiness from swallowing air while breastfeeding. Signs that your baby has successfully latched on to your nipple:    Silent tugging or silent sucking, without causing you pain.   Swallowing heard between every 3 4 sucks.    Muscle movement above and in front of his or her ears while sucking.  Signs that your baby has not successfully latched on to nipple:   Sucking sounds or smacking sounds from your baby while breastfeeding.  Nipple pain. If you think your baby has not latched on correctly, slip your finger into the corner of your baby's mouth to break the suction and place it between your baby's gums. Attempt breastfeeding initiation again. Signs of Successful Breastfeeding Signs from your baby:   A gradual decrease in the number of sucks or complete cessation of sucking.   Falling asleep.   Relaxation of his or her body.   Retention of a small amount of milk in his or her mouth.   Letting go of your breast by himself or herself. Signs from you:  Breasts that have increased in firmness, weight, and size 1 3 hours after feeding.   Breasts that are softer immediately after breastfeeding.  Increased milk volume, as well as a change in milk consistency and color by the 5th day of breastfeeding.   Nipples that are not sore, cracked, or bleeding. Signs That Your Pecola Leisure is Getting Enough Milk  Wetting at least 3 diapers in a 24-hour period. The urine should be clear and pale yellow by age 380 days.  At least 3 stools in a 24-hour period by age 380 days. The stool should be soft and yellow.  At least 3 stools in a 24-hour period by age 38 days. The stool should be seedy and yellow.  No loss of weight greater than 10% of birth weight during the first 11 days of age.  Average weight gain of 4 7 ounces (120 210 mL) per week after age 2 days.  Consistent daily weight  gain by age 380 days, without weight loss after the age of 2 weeks. After a feeding, your baby may spit up a small amount. This is common. BREASTFEEDING FREQUENCY AND DURATION Frequent feeding will help you make more milk and can prevent sore nipples and breast engorgement. Breastfeed when you feel the need to reduce the fullness of your breasts or when your baby shows signs of hunger. This is called "breastfeeding on demand." Avoid introducing a pacifier to your baby while you are working to establish breastfeeding (the first 4 6 weeks after your baby is born). After this time you may choose to use a pacifier. Research has shown that pacifier use during the first year of a baby's life decreases the risk of sudden infant death syndrome (SIDS). Allow your baby to feed on each breast as long as he or she wants. Breastfeed until your baby is finished feeding. When your baby unlatches or falls asleep while feeding from the first breast, offer the second breast. Because newborns are often sleepy in the first few weeks of life, you may need to awaken your baby to get him or her to feed. Breastfeeding times will vary from baby to baby. However, the following rules can serve as a guide to help you ensure that your baby is properly fed:  Newborns (babies 4 weeks of age or younger) may breastfeed every 1 3 hours.  Newborns should not go longer than 3 hours during the day or  5 hours during the night without breastfeeding.  You should breastfeed your baby a minimum of 8 times in a 24-hour period until you begin to introduce solid foods to your baby at around 70 months of age. BREAST MILK PUMPING Pumping and storing breast milk allows you to ensure that your baby is exclusively fed your breast milk, even at times when you are unable to breastfeed. This is especially important if you are going back to work while you are still breastfeeding or when you are not able to be present during feedings. Your lactation consultant  can give you guidelines on how long it is safe to store breast milk.  A breast pump is a machine that allows you to pump milk from your breast into a sterile bottle. The pumped breast milk can then be stored in a refrigerator or freezer. Some breast pumps are operated by hand, while others use electricity. Ask your lactation consultant which type will work best for you. Breast pumps can be purchased, but some hospitals and breastfeeding support groups lease breast pumps on a monthly basis. A lactation consultant can teach you how to hand express breast milk, if you prefer not to use a pump.  CARING FOR YOUR BREASTS WHILE YOU BREASTFEED Nipples can become dry, cracked, and sore while breastfeeding. The following recommendations can help keep your breasts moisturized and healthy:  Avoid using soap on your nipples.   Wear a supportive bra. Although not required, special nursing bras and tank tops are designed to allow access to your breasts for breastfeeding without taking off your entire bra or top. Avoid wearing underwire style bras or extremely tight bras.  Air dry your nipples for 3 after each feeding.   Use only cotton bra pads to absorb leaked breast milk. Leaking of breast milk between feedings is normal.   Use lanolin on your nipples after breastfeeding. Lanolin helps to maintain your skin's normal moisture barrier. If you use pure lanolin you do not need to wash it off before feeding your baby again. Pure lanolin is not toxic to your baby. You may also hand express a few drops of breast milk and gently massage that milk into your nipples and allow the milk to air dry. In the first few weeks after giving birth, some women experience extremely full breasts (engorgement). Engorgement can make your breasts feel heavy, warm, and tender to the touch. Engorgement peaks within 3 5 days after you give birth. The following recommendations can help ease engorgement:  Completely empty your  breasts while breastfeeding or pumping. You may want to start by applying warm, moist heat (in the shower or with warm water-soaked hand towels) just before feeding or pumping. This increases circulation and helps the milk flow. If your baby does not completely empty your breasts while breastfeeding, pump any extra milk after he or she is finished.  Wear a snug bra (nursing or regular) or tank top for 1 2 days to signal your body to slightly decrease milk production.  Apply ice packs to your breasts, unless this is too uncomfortable for you.  Make sure that your baby is latched on and positioned properly while breastfeeding. If engorgement persists after 48 hours of following these recommendations, contact your health care provider or a Advertising copywriter. OVERALL HEALTH CARE RECOMMENDATIONS WHILE BREASTFEEDING  Eat healthy foods. Alternate between meals and snacks, eating 3 of each per day. Because what you eat affects your breast milk, some of the foods may make your  baby more irritable than usual. Avoid eating these foods if you are sure that they are negatively affecting your baby.  Drink milk, fruit juice, and water to satisfy your thirst (about 10 glasses a day).   Rest often, relax, and continue to take your prenatal vitamins to prevent fatigue, stress, and anemia.  Continue breast self-awareness checks.  Avoid chewing and smoking tobacco.  Avoid alcohol and drug use. Some medicines that may be harmful to your baby can pass through breast milk. It is important to ask your health care provider before taking any medicine, including all over-the-counter and prescription medicine as well as vitamin and herbal supplements. It is possible to become pregnant while breastfeeding. If birth control is desired, ask your health care provider about options that will be safe for your baby. SEEK MEDICAL CARE IF:   You feel like you want to stop breastfeeding or have become frustrated with  breastfeeding.  You have painful breasts or nipples.  Your nipples are cracked or bleeding.  Your breasts are red, tender, or warm.  You have a swollen area on either breast.  You have a fever or chills.  You have nausea or vomiting.  You have drainage other than breast milk from your nipples.  Your breasts do not become full before feedings by the 5th day after you give birth.  You feel sad and depressed.  Your baby is too sleepy to eat well.  Your baby is having trouble sleeping.   Your baby is wetting less than 3 diapers in a 24-hour period.  Your baby has less than 3 stools in a 24-hour period.  Your baby's skin or the white part of his or her eyes becomes yellow.   Your baby is not gaining weight by 365 days of age. SEEK IMMEDIATE MEDICAL CARE IF:   Your baby is overly tired (lethargic) and does not want to wake up and feed.  Your baby develops an unexplained fever. Document Released: 09/30/2005 Document Revised: 06/02/2013 Document Reviewed: 03/24/2013 [facility] Patient Information 2014 [place].

## 2014-01-01 NOTE — Discharge Summary (Signed)
  Obstetric Discharge Summary Reason for Admission: onset of labor Prenatal Procedures: none Intrapartum Procedures: spontaneous vaginal delivery Postpartum Procedures: none Complications-Operative and Postpartum: none  Hemoglobin  Date Value Ref Range Status  01/01/2014 10.3* 12.0 - 15.0 g/dL Final     HCT  Date Value Ref Range Status  01/01/2014 29.9* 36.0 - 46.0 % Final    Physical Exam:  General: alert Lochia: appropriate Uterine: firm Incision: n/a DVT Evaluation: No evidence of DVT seen on physical exam.  Discharge Diagnoses: Active Problems:   Active labor   NVD (normal vaginal delivery)   Discharge Information: Date: 01/01/2014 Activity: pelvic rest Diet: routine Medications:  Prior to Admission medications   Medication Sig Start Date End Date Taking? Authorizing Provider  Ferrous Fumarate (IRON) 18 MG TBCR Take 1 tablet (18 mg total) by mouth every morning. 01/01/14   Antionette CharLisa Jackson-Moore, MD  oxyCODONE-acetaminophen (PERCOCET/ROXICET) 5-325 MG per tablet Take 1-2 tablets by mouth every 3 (three) hours as needed for severe pain (for pain scale > 4). 01/01/14   Antionette CharLisa Jackson-Moore, MD  Prenatal Vit-Fe Fumarate-FA (PRENATAL MULTIVITAMIN) TABS tablet Take 1 tablet by mouth daily at 12 noon. 01/01/14   Antionette CharLisa Jackson-Moore, MD    Condition: stable Instructions: refer to routine discharge instructions Discharge to: home   Newborn Data:  Live born female  Birth Weight: 7 lb 5 oz (3317 g) APGAR: 9, 9   Home with mother.  JACKSON-MOORE,Dantae Meunier A 01/01/2014, 12:52 PM

## 2014-01-02 ENCOUNTER — Ambulatory Visit: Payer: Self-pay

## 2014-01-02 NOTE — Lactation Note (Signed)
This note was copied from the chart of Misty Curry. Lactation Consultation Note    Follow up consult with this mom and baby, now 50 hours post partum. Mom's milk is in, and she is slightly engorged. She is doing very well with breast feeding, engorgement care done, and hand pump givin.   Patient Name: Misty Curry ZOXWR'UToday's Date: 01/02/2014 Reason for consult: Follow-up assessment   Maternal Data    Feeding Feeding Type: Breast Fed Length of feed: 10 min  LATCH Score/Interventions Latch: Grasps breast easily, tongue down, lips flanged, rhythmical sucking.  Audible Swallowing: A few with stimulation  Type of Nipple: Everted at rest and after stimulation  Comfort (Breast/Nipple): Engorged, cracked, bleeding, large blisters, severe discomfort Problem noted: Engorgment Intervention(s): Ice;Hand expression;Cabbage leaves  Problem noted: Mild/Moderate discomfort  Hold (Positioning): No assistance needed to correctly position infant at breast. Intervention(s): Breastfeeding basics reviewed;Support Pillows;Position options;Skin to skin  LATCH Score: 7  Lactation Tools Discussed/Used Tools: Pump Breast pump type: Manual   Consult Status Consult Status: Complete Follow-up type: Call as needed    Misty Curry, Misty Curry 01/02/2014, 1:24 PM

## 2014-01-08 ENCOUNTER — Inpatient Hospital Stay (HOSPITAL_COMMUNITY)
Admission: AD | Admit: 2014-01-08 | Discharge: 2014-01-08 | Disposition: A | Discharge status: Home / Self Care | Payer: Medicaid Other | Source: Ambulatory Visit | Attending: Obstetrics | Admitting: Obstetrics

## 2014-01-08 ENCOUNTER — Encounter (HOSPITAL_COMMUNITY): Payer: Self-pay | Admitting: *Deleted

## 2014-01-08 DIAGNOSIS — O9122 Nonpurulent mastitis associated with the puerperium: Secondary | ICD-10-CM | POA: Insufficient documentation

## 2014-01-08 DIAGNOSIS — O9123 Nonpurulent mastitis associated with lactation: Secondary | ICD-10-CM

## 2014-01-08 DIAGNOSIS — N644 Mastodynia: Secondary | ICD-10-CM | POA: Insufficient documentation

## 2014-01-08 MED ORDER — IBUPROFEN 600 MG PO TABS: 600.0000 mg | ORAL_TABLET | Freq: Four times a day (QID) | ORAL | Status: DC | PRN

## 2014-01-08 MED ORDER — AMOXICILLIN-POT CLAVULANATE 875-125 MG PO TABS: 1.0000 | ORAL_TABLET | Freq: Two times a day (BID) | ORAL | Status: DC

## 2014-01-08 MED ORDER — ACETAMINOPHEN 500 MG PO TABS
1000.0000 mg | ORAL_TABLET | Freq: Once | ORAL | Status: AC
  Administered 2014-01-08: 1000 mg via ORAL
  Filled 2014-01-08: qty 2

## 2014-01-08 NOTE — MAU Note (Signed)
Lactation Consultant contacted and asked to come evaluate patient.

## 2014-01-08 NOTE — MAU Provider Note (Signed)
CC: Breast Pain    First Provider Initiated Contact with Patient 01/08/14 1543      HPI Misty Curry is a 23 y.o. G1P1001 now 9 d post NSVD who presents with onset today of fever/chills and left breast pain. She is pumping as well as nursing because she feels she is producine too much milk.  Only postpartum issue is perineal pain at site of her laceration repair. Denies dysuria, hematuria, urgency or frequency of urination. Denies back pain for nausea vomiting.  Denies abdominal pain  Past Medical History  Diagnosis Date  . Medical history non-contributory     OB History  Gravida Para Term Preterm AB SAB TAB Ectopic Multiple Living  1 1 1       1     # Outcome Date GA Lbr Len/2nd Weight Sex Delivery Anes PTL Lv  1 TRM 12/31/13 [id number] 11:37 / 00:11 3.317 kg (7 lb 5 oz) F SVD EPI  Y      Past Surgical History  Procedure Laterality Date  . Hernia repair    . Mole removal      History   Social History  . Marital Status: Single    Spouse Name: N/A    Number of Children: N/A  . Years of Education: N/A   Occupational History  . Not on file.   Social History Main Topics  . Smoking status: Never Smoker   . Smokeless tobacco: Never Used  . Alcohol Use: No  . Drug Use: No  . Sexual Activity: No   Other Topics Concern  . Not on file   Social History Narrative  . No narrative on file    No current facility-administered medications on file prior to encounter.   Current Outpatient Prescriptions on File Prior to Encounter  Medication Sig Dispense Refill  . Ferrous Fumarate (IRON) 18 MG TBCR Take 1 tablet (18 mg total) by mouth every morning.  30 each  3  . oxyCODONE-acetaminophen (PERCOCET/ROXICET) 5-325 MG per tablet Take 1-2 tablets by mouth every 3 (three) hours as needed for severe pain (for pain scale > 4).  30 tablet  0  . Prenatal Vit-Fe Fumarate-FA (PRENATAL MULTIVITAMIN) TABS tablet Take 1 tablet by mouth daily at 12 noon.  90 tablet  3    No Known  Allergies  ROS Pertinent items in HPI  PHYSICAL EXAM Filed Vitals:   01/08/14 1523  BP: 129/81  Pulse: 124  Temp: 102.5 F (39.2 C)  Resp: 20   General: Well nourished, well developed female in no acute distress Breasts: mild engorgement bilaterally. Tender erythematous area left breast wedge from areola to 11-1 o'clock Cardiovascular: Normal rate Respiratory: Normal effort Abdomen: Soft, nontender Back: No CVAT Extremities: No edema Neurologic: Alert and oriented Speculum exam: NEFG; vagina with physiologic discharge, no blood; cervix clean Bimanual exam: cervix closed, no CMT; uterus NSSP; no adnexal tenderness or masses   LAB RESULTS No results found for this or any previous visit (from the past 24 hour(s)).  IMAGING No results found.  MAU COURSE LC saw patient C/W Dr. Clearance CootsHarper> Augmentin  ASSESSMENT  1. Nonpurulent mastitis associated with lactation   G1P1001 9 days PP  PLAN Discharge home. See AVS for patient education.    Medication List         amoxicillin-clavulanate 875-125 MG per tablet  Commonly known as:  AUGMENTIN  Take 1 tablet by mouth 2 (two) times daily.     ibuprofen 600 MG tablet  Commonly known as:  ADVIL,MOTRIN  Take 1 tablet (600 mg total) by mouth every 6 (six) hours as needed.     Iron 18 MG Tbcr  Take 1 tablet (18 mg total) by mouth every morning.     oxyCODONE-acetaminophen 5-325 MG per tablet  Commonly known as:  PERCOCET/ROXICET  Take 1-2 tablets by mouth every 3 (three) hours as needed for severe pain (for pain scale > 4).     prenatal multivitamin Tabs tablet  Take 1 tablet by mouth daily at 12 noon.       Follow-up Information   Follow up with HARPER,CHARLES A, MD. (Keep your scheduled postnatal appointment)    Specialty:  Obstetrics and Gynecology   Contact information:   [address] [place] [address] [phone]       Follow up with Kathreen Cosier, MD. (Keep your scheduled postnatal  appointment)    Specialty:  Obstetrics and Gynecology   Contact information:   [address] [place] [address] [phone]         Danae Orleans, CNM 01/08/2014 3:49 PM

## 2014-01-08 NOTE — MAU Note (Signed)
Patient instructed on how to use an electric breast pump and bilateral pumping in progress.

## 2014-01-08 NOTE — Discharge Instructions (Signed)
Mastitis  Mastitis is inflammation of the breast tissue. It occurs most often in women who are breastfeeding, but it can also affect other women, and even sometimes men. CAUSES  Mastitis is usually caused by a bacterial infection. Bacteria enter the breast tissue through cuts or openings in the skin. Typically, this occurs with breastfeeding because of cracked or irritated skin. Sometimes, it can occur even when there is no opening in the skin. It can be associated with plugged milk (lactiferous) ducts. Nipple piercing can also lead to mastitis. Also, some forms of breast cancer can cause mastitis. SIGNS AND SYMPTOMS   Swelling, redness, tenderness, and pain in an area of the breast.  Swelling of the glands under the arm on the same side.  Fever. If an infection is allowed to progress, a collection of pus (abscess) may develop. DIAGNOSIS  Your health care provider can usually diagnose mastitis based on your symptoms and a physical exam. Tests may be done to help confirm the diagnosis. These may include:   Removal of pus from the breast by applying pressure to the area. This pus can be examined in the lab to determine which bacteria are present. If an abscess has developed, the fluid in the abscess can be removed with a needle. This can also be used to confirm the diagnosis and determine the bacteria present. In most cases, pus will not be present.  Blood tests to determine if your body is fighting a bacterial infection.  Mammogram or ultrasound tests to rule out other problems or diseases. TREATMENT  Antibiotic medicine is used to treat a bacterial infection. Your health care provider will determine which bacteria are most likely causing the infection and will select an appropriate antibiotic. This is sometimes changed based on the results of tests performed to identify the bacteria, or if there is no response to the antibiotic selected. Antibiotics are usually given by mouth. You may also be  given medicine for pain. Mastitis that occurs with breastfeeding will sometimes go away on its own, so your health care provider may choose to wait 24 hours after first seeing you to decide whether a prescription medicine is needed. HOME CARE INSTRUCTIONS   Only take over-the-counter or prescription medicines for pain, fever, or discomfort as directed by your health care provider.  If your health care provider prescribed an antibiotic, take the medicine as directed. Make sure you finish it even if you start to feel better.  Do not wear a tight or underwire bra. Wear a soft, supportive bra.  Increase your fluid intake, especially if you have a fever.  Women who are breastfeeding should follow these instructions:  Continue to empty the breast. Your health care provider can tell you whether this milk is safe for your infant or needs to be thrown out. You may be told to stop nursing until your health care provider thinks it is safe for your baby. Use a breast pump if you are advised to stop nursing.  Keep your nipples clean and dry.  Empty the first breast completely before going to the other breast. If your baby is not emptying your breasts completely for some reason, use a breast pump to empty your breasts.  If you go back to work, pump your breasts while at work to stay in time with your nursing schedule.  Avoid allowing your breasts to become overly filled with milk (engorged). SEEK MEDICAL CARE IF:   You have pus-like discharge from the breast.  Your symptoms do  not improve with the treatment prescribed by your health care provider within 2 days. SEEK IMMEDIATE MEDICAL CARE IF:   Your pain and swelling are getting worse.  You have pain that is not controlled with medicine.  You have a red line extending from the breast toward your armpit.  You have a fever or persistent symptoms for more than 2 3 days.  You have a fever and your symptoms suddenly get worse. Document Released:  09/30/2005 Document Revised: 07/21/2013 Document Reviewed: 04/30/2013 [facility] Patient Information 2014 [place], [place].  Breastfeeding Challenges and Solutions Even though breastfeeding is natural, it can be challenging, especially in the first few weeks after childbirth. It is normal for problems to arise when starting to breastfeed your new baby, even if you have breastfed before. This document provides some solutions to the most common breastfeeding challenges.  CHALLENGES AND SOLUTIONS Challenge Cracked or Sore Nipples Cracked or sore nipples are commonly experienced by breastfeeding mothers. Cracked or sore nipples often are caused by inadequate latching (when your baby's mouth attaches to your breast to breastfeed). Soreness can also happen if your baby is not positioned properly at your breast. Although nipple cracking and soreness are common during the first week after birth, nipple pain is never normal. If you experience nipple cracking or soreness that lasts longer than 1 week or nipple pain, call your health care provider or lactation consultant.  Solution Ensure proper latching and positioning of your baby by following the steps below:  Find a comfortable place to sit or lie down, with your neck and back well supported.  Place a pillow or rolled up blanket under your baby to bring him or her to the level of your breast (if you are seated).  Make sure that your baby's abdomen is facing your abdomen.  Gently massage your breast. With your fingertips, massage from your chest wall toward your nipple in a circular motion. This encourages milk flow. You may need to continue this action during the feeding if your milk flows slowly.  Support your breast with 4 fingers underneath and your thumb above your nipple. Make sure your fingers are well away from your nipple and your baby's mouth.  Stroke your baby's lips gently with your finger or nipple.  When your baby's mouth is open wide  enough, quickly bring your baby to your breast, placing your entire nipple and as much of the colored area around your nipple (areola) as possible into your baby's mouth.  More areola should be visible above your baby's upper lip than below the lower lip.  Your baby's tongue should be between his or her lower gum and your breast.  Ensure that your baby's mouth is correctly positioned around your nipple (latched). Your baby's lips should create a seal on your breast and be turned out (everted).  It is common for your baby to suck for about 2 3 minutes in order to start the flow of breast milk. Signs that your baby has successfully latched on to your nipple include:   Quietly tugging or quietly sucking without causing you pain.   Swallowing heard between every 3 4 sucks.   Muscle movement above and in front of his or her ears with sucking.  Signs that your baby has not successfully latched on to nipple include:   Sucking sounds or smacking sounds from your baby while nursing.   Nipple pain.  Ensure that your breasts stay moisturized and healthy by:  Avoiding the use of soap on  your nipples.   Wearing a supportive bra. Avoid wearing under wire style bras or tight bras.   Air drying your nipples for 3 4 minutes after each feeding.   Using only cotton bra pads to absorb breast milk leakage. Leaking of breast milk between feedings is normal. Be sure to change the pads if they become soaked with milk.  Using lanolin on your nipples after nursing. Lanolin helps to maintain your skin's normal moisture barrier. If you use pure lanolin you do not need to wash it off before feeding your baby again. Pure lanolin is not toxic to your baby. You may also hand express a few drops of breast milk and gently massage that milk into your nipples, allowing it to air dry. Challenge Breast Engorgement Breast engorgement is the overfilling of your breasts with breast milk. In the first few weeks  after giving birth, you may experience breast engorgement. Breast engorgement can make your breasts throb and feel hard, tightly stretched, warm, and tender. Engorgement peaks about the fifth day after you give birth. Having breast engorgement does not mean you have to stop breastfeeding your baby. Solution  Breastfeed when you feel the need to reduce the fullness of your breasts or when your baby shows signs of hunger. This is called "breastfeeding on demand."  Newborns (babies younger than 4 weeks) often breastfeed every 1 3 hours during the day. You may need to awaken your baby to feed if he or she is asleep at a feeding time.  Do not allow your baby to sleep longer than 5 hours during the night without a feeding.  Pump or hand express breast milk before breastfeeding to soften your breast, areola, and nipple.  Apply warm, moist heat (in the shower or with warm water-soaked hand towels) just before feeding or pumping, or massage your breast before or during breastfeeding. This increases circulation and helps your milk to flow.  Completely empty your breasts when breastfeeding or pumping. Afterward, wear a snug bra (nursing or regular) or tank top for 1 2 days to signal your body to slightly decrease milk production. Only wear snug bras or tank tops to treat engorgement. Tight bras typically should be avoided by breastfeeding mothers. Once engorgement is relieved, return to wearing regular, loose-fitting clothes.  Apply ice packs to your breasts to lessen the pain from engorgement and relieve swelling, unless the ice is uncomfortable for you.  Do not delay feedings. Try to relax when it is time to feed your baby. This helps to trigger your "let-down reflex," which releases milk from your breast.  Ensure your baby is latched on to your breast and positioned properly while breastfeeding.  Allow your baby to remain at your breast as long as he or she is latched on well and actively sucking. Your  baby will let you know when he or she is done breastfeeding by pulling away from your breast or falling asleep.  Avoid introducing bottles or pacifiers to your baby in the early weeks of breastfeeding. Wait to introduce these things until after resolving any breastfeeding challenges.  Try to pump your milk on the same schedule as when your baby would breastfeed if you are returning to work or away from home for an extended period.  Drink plenty of fluids to avoid dehydration, which can eventually put you at greater risk of breast engorgement. If you follow these suggestions, your engorgement should improve in 24 48 hours. If you are still experiencing difficulty, call your lactation  consultant or health care provider.  Challenge Plugged Milk Ducts Plugged milk ducts occur when the duct does not drain milk effectively and becomes swollen. Wearing a tight-fitting nursing bra or having difficulty with latching may cause plugged milk ducts. Not drinking enough water (8 10 c [1.9 2.4 L] per day) can contribute to plugged milk ducts. Once a duct has become plugged, hard lumps, soreness, and redness may develop in your breast.  Solution Do not delay feedings. Feed your baby frequently and try to empty your breasts of milk at each feeding. Try breastfeeding from the affected side first so there is a better chance that the milk will drain completely from that breast. Apply warm, moist towels to your breasts for 5 10 minutes before feeding. Alternatively, a hot shower right before breastfeeding can provide the moist heat that can encourage milk flow. Gentle massage of the sore area before and during a feeding may also help. Avoid wearing tight clothing or bras that put pressure on your breasts. Wear bras that offer good support to your breasts, but avoid underwire bras. If you have a plugged milk duct and develop a fever, you need to see your health care provider.  Challenge Mastitis Mastitis is inflammation of  your breast. It usually is caused by a bacterial infection and can cause flu-like symptoms. You may develop redness in your breast and a fever. Often when mastitis occurs, your breast becomes firm, warm, and very painful. The most common causes of mastitis are poor latching, ineffective sucking from your baby, consistent pressure on your breast (possibly from wearing a tight-fitting bra or shirt that restricts the milk flow), unusual stress or fatigue, or missed feedings.  Solution You will be given antibiotic medicine to treat the infection. It is still important to breastfeed frequently to empty your breasts. Continuing to breastfeed while you recover from mastitis will not harm your baby. Make sure your baby is positioned properly during every feeding. Apply moist heat to your breasts for a few minutes before feeding to help the milk flow and to help your breasts empty more easily. Challenge Camelia Phenes is a yeast infection that can form on your nipples, in your breast, or in your baby's mouth. It causes itching, soreness, burning or stabbing pain, and sometimes a rash.  Solution You will be given a medicated ointment for your nipples, and your baby will be given a liquid medicine for his or her mouth. It is important that you and your baby are treated at the same time because thrush can be passed between you and your baby. Change disposable nursing pads often. Any bras, towels, or clothing that come in contact with infected areas of your body or your baby's body need to be washed in very hot water every day. Wash your hands and your baby's hands often. All pacifiers, bottle nipples, or toys your baby puts in his or her mouth should be boiled once a day for 20 minutes. After 1 week of treatment, discard pacifiers and bottle nipples and buy new ones. All breast pump parts that touch the milk need to be boiled for 20 minutes every day. Challenge Low Milk Supply You may not be producing enough milk if your  baby is not gaining the proper amount of weight. Breast milk production is based on a supply-and-demand system. Your milk supply depends on how frequently and effectively your baby empties your breast. Solution The more you breastfeed and pump, the more breast milk you will produce. It  is important that your baby empties at least one of your breasts at each feeding. If this is not happening, then use a breast pump or hand express any milk that remains. This will help to drain as much milk as possible at each feeding. It will also signal your body to produce more milk. If your baby is not emptying your breasts, it may be due to latching, sucking, or positioning problems. If low milk supply continues after addressing these issues, contact your health care provider or a lactation specialist as soon as possible. Challenge Inverted or Flat Nipples Some women have nipples that turn inward instead of protruding outward. Other women have nipples that are flat. Inverted or flat nipples can sometimes make it more difficult for your baby to latch onto your breast. Solution You may be given a small device that pulls out inverted nipples. This device should be applied right before your baby is brought to your breast. You can also try using a breast pump for a short time before placing the baby at your breast. The pump can pull your nipple outwards to help your infant latch more easily. The baby's sucking motion will help the inverted nipple protrude as well.  If you have flat nipples, encourage your baby to latch onto your breast and feed frequently in the early days after birth. This will give your baby practice latching on correctly while your breast is still soft. When your milk supply increases, between the second and fifth day after birth and your breasts become full, your baby will have an easier time latching.  Contact a lactation consultant if you still have concerns. She or he can teach you additional  techniques to address breastfeeding problems related to nipple shape and position.  FOR MORE INFORMATION [employer]: www.llli.org Document Released: 03/24/2006 Document Revised: 06/02/2013 Document Reviewed: 03/26/2013 [facility] Patient Information 2014 [place]

## 2014-01-08 NOTE — Lactation Note (Signed)
Lactation Consultation Note  Patient Name: Misty Curry'XToday's Date: 01/08/2014   Pt to MAU with c/o breast hurting and temp of 102.5.  MAU RN got mom pumping with DEBP and mom pumped 250+ml of expressed breast milk.  Mom reports exclusively breastfeeding and using a hand pump for discomfort after breastfeeding.  Upon assessment breast felt firm in areas and was tender to pt when LC palpated, but no reddened areas or hard areas noted.  Mom denied any color changes in nipples or skin with exception of crack noted at top of left nipple.  Comfort gels given to mom and explained how to use.  Encouraged mom to continue exclusive direct breastmilk feedings and pumping with hand pump only when breast feel uncomfortable after feedings (pump to comfort).  Explained to mom rationale for pump-to-comfort and encouraged mom to allow infant to empty one side and end the feeding on that side before switching over; mom stated she had been doing that.  Encouraged mom to continue taking ibuprofen to decrease inflammation along with cold compresses or ice for comfort.  Discussed symptoms of engorgement and mastitis and educated to continue breastfeeding even with mastitis that milk is still good.  Patient verbalized understanding and did not have any further questions.     Lendon KaVann, Renell Allum Walker 01/08/2014, 4:49 PM

## 2014-01-08 NOTE — MAU Note (Signed)
Patient presents with complaint of bilateral breast pain today. States that she has been breastfeeding since the 20th.

## 2014-05-10 ENCOUNTER — Inpatient Hospital Stay (HOSPITAL_COMMUNITY)
Admission: AD | Admit: 2014-05-10 | Discharge: 2014-05-10 | Disposition: A | Discharge status: Home / Self Care | Payer: Medicaid Other | Source: Ambulatory Visit | Attending: Obstetrics | Admitting: Obstetrics

## 2014-05-10 ENCOUNTER — Encounter (HOSPITAL_COMMUNITY): Payer: Self-pay | Admitting: *Deleted

## 2014-05-10 ENCOUNTER — Emergency Department (HOSPITAL_COMMUNITY)
Admission: EM | Admit: 2014-05-10 | Discharge: 2014-05-10 | Discharge status: Absent without leave | Payer: Medicaid Other | Source: Home / Self Care

## 2014-05-10 DIAGNOSIS — M545 Low back pain, unspecified: Secondary | ICD-10-CM

## 2014-05-10 DIAGNOSIS — M549 Dorsalgia, unspecified: Secondary | ICD-10-CM | POA: Insufficient documentation

## 2014-05-10 DIAGNOSIS — O9989 Other specified diseases and conditions complicating pregnancy, childbirth and the puerperium: Principal | ICD-10-CM

## 2014-05-10 DIAGNOSIS — O99893 Other specified diseases and conditions complicating puerperium: Secondary | ICD-10-CM | POA: Insufficient documentation

## 2014-05-10 LAB — URINALYSIS, ROUTINE W REFLEX MICROSCOPIC
Bilirubin Urine: NEGATIVE
Glucose, UA: NEGATIVE mg/dL
Hgb urine dipstick: NEGATIVE
Ketones, ur: NEGATIVE mg/dL
LEUKOCYTES UA: NEGATIVE
Nitrite: NEGATIVE
PROTEIN: NEGATIVE mg/dL
Specific Gravity, Urine: 1.01 (ref 1.005–1.030)
UROBILINOGEN UA: 0.2 mg/dL (ref 0.0–1.0)
pH: 7 (ref 5.0–8.0)

## 2014-05-10 LAB — POCT PREGNANCY, URINE: PREG TEST UR: NEGATIVE

## 2014-05-10 NOTE — MAU Provider Note (Signed)
History     CSN: [id number]  Arrival date and time: 05/10/14 1422   First Provider Initiated Contact with Patient 05/10/14 1537      Chief Complaint  Patient presents with  . Back Pain   HPI [name] is a 23 y.o. G1P1001 who is ~ 4 months PP after SVD with epidural complaining of back pain. The patient states back pain at point of epidural insertion that has been present since delivery. She states that she was given Ibuprofen and Flexeril by Dr. Gaynell FaceMarshall which she took without relief. She denies UTI symptoms, abdominal pain or radiation of pain.   OB History   Grav Para Term Preterm Abortions TAB SAB Ect Mult Living   1 1 1       1       Past Medical History  Diagnosis Date  . Medical history non-contributory     Past Surgical History  Procedure Laterality Date  . Hernia repair    . Mole removal      Family History  Problem Relation Age of Onset  . Alcohol abuse Neg Hx   . Asthma Neg Hx   . Arthritis Neg Hx   . Birth defects Neg Hx   . Cancer Neg Hx   . COPD Neg Hx   . Depression Neg Hx   . Diabetes Neg Hx   . Drug abuse Neg Hx   . Early death Neg Hx   . Hearing loss Neg Hx   . Heart disease Neg Hx   . Hyperlipidemia Neg Hx   . Hypertension Neg Hx   . Kidney disease Neg Hx   . Learning disabilities Neg Hx   . Mental illness Neg Hx   . Mental retardation Neg Hx   . Miscarriages / Stillbirths Neg Hx   . Stroke Neg Hx   . Vision loss Neg Hx   . Varicose Veins Neg Hx     History  Substance Use Topics  . Smoking status: Never Smoker   . Smokeless tobacco: Never Used  . Alcohol Use: No    Allergies: No Known Allergies  Prescriptions prior to admission  Medication Sig Dispense Refill  . Ferrous Fumarate (IRON) 18 MG TBCR Take 1 tablet (18 mg total) by mouth every morning.  30 each  3  . ibuprofen (ADVIL,MOTRIN) 600 MG tablet Take 1 tablet (600 mg total) by mouth every 6 (six) hours as needed.  30 tablet  1    Review of Systems   Constitutional: Negative for fever and malaise/fatigue.  Gastrointestinal: Negative for nausea, vomiting and abdominal pain.  Genitourinary: Negative for dysuria, urgency and frequency.       Neg - vaginal bleeding, discharge  Musculoskeletal: Positive for back pain.   Physical Exam   Blood pressure 122/73, pulse 76, temperature 98.4 F (36.9 C), temperature source Oral, resp. rate 18, height 5\' 3"  (1.6 m), weight 176 lb 12.8 oz (80.196 kg), last menstrual period 05/09/2014, currently breastfeeding.  Physical Exam  Constitutional: She is oriented to person, place, and time. She appears well-developed and well-nourished. No distress.  HENT:  Head: Normocephalic.  Cardiovascular: Normal rate.   Respiratory: Effort normal.  Musculoskeletal:       Lumbar back: She exhibits tenderness and pain. She exhibits normal range of motion, no bony tenderness, no swelling, no edema and no spasm.  Patient is point tender at midline of the spine  Neurological: She is alert and oriented to person, place, and time.  Skin: Skin is warm and dry. No erythema.  Psychiatric: She has a normal mood and affect.   Results for orders placed during the hospital encounter of 05/10/14 (from the past 24 hour(s))  URINALYSIS, ROUTINE W REFLEX MICROSCOPIC     Status: None   Collection Time    05/10/14  2:55 PM      Result Value Ref Range   Color, Urine YELLOW  YELLOW   APPearance CLEAR  CLEAR   Specific Gravity, Urine 1.010  1.005 - 1.030   pH 7.0  5.0 - 8.0   Glucose, UA NEGATIVE  NEGATIVE mg/dL   Hgb urine dipstick NEGATIVE  NEGATIVE   Bilirubin Urine NEGATIVE  NEGATIVE   Ketones, ur NEGATIVE  NEGATIVE mg/dL   Protein, ur NEGATIVE  NEGATIVE mg/dL   Urobilinogen, UA 0.2  0.0 - 1.0 mg/dL   Nitrite NEGATIVE  NEGATIVE   Leukocytes, UA NEGATIVE  NEGATIVE  POCT PREGNANCY, URINE     Status: None   Collection Time    05/10/14  3:00 PM      Result Value Ref Range   Preg Test, Ur NEGATIVE  NEGATIVE    MAU  Course  Procedures None  MDM UPT - negative UA today  Assessment and Plan  A: Back pain postpartum  P: Discharge home Recommend continued ibuprofen and additional of ice as well as topical therapy PRN pain Back exercises given Patient advised to follow-up with Dr. Gaynell Face for Chiropractic or PT referral Patient may return to MAU as needed or if her condition were to change or worsen  Freddi Starr, PA-C  05/10/2014, 3:38 PM

## 2014-05-10 NOTE — Discharge Instructions (Signed)

## 2014-05-10 NOTE — MAU Note (Signed)
Lower back pain for the past 4 months, ever since she had an epidural here feels like back spasms.  Has seen Dr. Gaynell FaceMarshall for this, was treated with ibuprofen & muscle relaxer with no improvement.

## 2014-05-10 NOTE — Progress Notes (Signed)
Joseph BerkshireJulie Ethier PA in to see pt and discuss test results and d/c plan. WRitten and verbal d/c instructions given and understanding voiced.

## 2014-08-15 ENCOUNTER — Encounter (HOSPITAL_COMMUNITY): Payer: Self-pay | Admitting: *Deleted

## 2014-10-26 ENCOUNTER — Emergency Department (HOSPITAL_COMMUNITY)
Admission: EM | Admit: 2014-10-26 | Discharge: 2014-10-27 | Disposition: A | Discharge status: Home / Self Care | Payer: Medicaid Other | Attending: Emergency Medicine | Admitting: Emergency Medicine

## 2014-10-26 ENCOUNTER — Encounter (HOSPITAL_COMMUNITY): Payer: Self-pay | Admitting: Emergency Medicine

## 2014-10-26 DIAGNOSIS — J029 Acute pharyngitis, unspecified: Secondary | ICD-10-CM | POA: Diagnosis present

## 2014-10-26 DIAGNOSIS — R000 Tachycardia, unspecified: Secondary | ICD-10-CM | POA: Insufficient documentation

## 2014-10-26 DIAGNOSIS — E860 Dehydration: Secondary | ICD-10-CM | POA: Diagnosis not present

## 2014-10-26 LAB — I-STAT CHEM 8, ED
BUN: 9 mg/dL (ref 6–23)
CREATININE: 0.9 mg/dL (ref 0.50–1.10)
Calcium, Ion: 1.07 mmol/L — ABNORMAL LOW (ref 1.12–1.23)
Chloride: 101 mEq/L (ref 96–112)
Glucose, Bld: 86 mg/dL (ref 70–99)
HCT: 42 % (ref 36.0–46.0)
HEMOGLOBIN: 14.3 g/dL (ref 12.0–15.0)
POTASSIUM: 4 mmol/L (ref 3.5–5.1)
Sodium: 138 mmol/L (ref 135–145)
TCO2: 24 mmol/L (ref 0–100)

## 2014-10-26 LAB — CBC WITH DIFFERENTIAL/PLATELET
BASOS PCT: 0 % (ref 0–1)
Basophils Absolute: 0 10*3/uL (ref 0.0–0.1)
EOS ABS: 0 10*3/uL (ref 0.0–0.7)
Eosinophils Relative: 0 % (ref 0–5)
HEMATOCRIT: 36.3 % (ref 36.0–46.0)
HEMOGLOBIN: 12.5 g/dL (ref 12.0–15.0)
LYMPHS ABS: 0.8 10*3/uL (ref 0.7–4.0)
Lymphocytes Relative: 6 % — ABNORMAL LOW (ref 12–46)
MCH: 29.8 pg (ref 26.0–34.0)
MCHC: 34.4 g/dL (ref 30.0–36.0)
MCV: 86.4 fL (ref 78.0–100.0)
MONO ABS: 0.7 10*3/uL (ref 0.1–1.0)
MONOS PCT: 6 % (ref 3–12)
NEUTROS PCT: 88 % — AB (ref 43–77)
Neutro Abs: 11 10*3/uL — ABNORMAL HIGH (ref 1.7–7.7)
Platelets: 266 10*3/uL (ref 150–400)
RBC: 4.2 MIL/uL (ref 3.87–5.11)
RDW: 12.7 % (ref 11.5–15.5)
WBC: 12.5 10*3/uL — ABNORMAL HIGH (ref 4.0–10.5)

## 2014-10-26 LAB — RAPID STREP SCREEN (MED CTR MEBANE ONLY): STREPTOCOCCUS, GROUP A SCREEN (DIRECT): NEGATIVE

## 2014-10-26 LAB — I-STAT BETA HCG BLOOD, ED (MC, WL, AP ONLY): I-stat hCG, quantitative: <5 m[IU]/mL (ref <5)

## 2014-10-26 LAB — I-STAT CG4 LACTIC ACID, ED: Lactic Acid, Venous: 1.04 mmol/L (ref 0.5–2.2)

## 2014-10-26 MED ORDER — IBUPROFEN 400 MG PO TABS
800.0000 mg | ORAL_TABLET | Freq: Once | ORAL | Status: AC
  Administered 2014-10-26: 800 mg via ORAL
  Filled 2014-10-26: qty 2

## 2014-10-26 MED ORDER — SODIUM CHLORIDE 0.9 % IV BOLUS (SEPSIS)
1000.0000 mL | Freq: Once | INTRAVENOUS | Status: AC
  Administered 2014-10-26: 1000 mL via INTRAVENOUS

## 2014-10-26 MED ORDER — LIDOCAINE VISCOUS 2 % MT SOLN
20.0000 mL | Freq: Once | OROMUCOSAL | Status: AC
  Administered 2014-10-26: 20 mL via OROMUCOSAL
  Filled 2014-10-26: qty 30

## 2014-10-26 NOTE — ED Provider Notes (Signed)
CSN: [id number]     Arrival date & time 10/26/14  2205 History  This chart was scribed for non-physician practitioner working with Doug SouSam Jacubowitz, MD by Richarda Overlieichard Holland, ED Scribe. This patient was seen in room TR10C/TR10C and the patient's care was started at 10:46 PM.    Chief Complaint  Patient presents with  . Sore Throat   HPI  HPI Comments: Misty Curry is a 24 y.o. female who presents to the Emergency Department complaining of a worsening sore throat that started yesterday. Pt reports associated HA, dizziness, dry cough, rhinorrhea, decrease in appetite and body aches as symptoms.  Pt states her 279 m.o daughter is sick currently as well. Pt states she has been taking nyquil and dayquil, with her last dose being this morning. She states that she did not receive a flu shot this year. Pt reports no history of smoking. She reports no pertinent medical history at this time. Pt reports NKDA.  His chest pain, shortness of breath, nausea, vomiting, change in bowel or bladder habits. Patient notes she has not urinated today. Endorses a reduced by mouth intake secondary to pain.    Past Medical History  Diagnosis Date  . Medical history non-contributory    Past Surgical History  Procedure Laterality Date  . Hernia repair    . Mole removal     Family History  Problem Relation Age of Onset  . Alcohol abuse Neg Hx   . Asthma Neg Hx   . Arthritis Neg Hx   . Birth defects Neg Hx   . Cancer Neg Hx   . COPD Neg Hx   . Depression Neg Hx   . Diabetes Neg Hx   . Drug abuse Neg Hx   . Early death Neg Hx   . Hearing loss Neg Hx   . Heart disease Neg Hx   . Hyperlipidemia Neg Hx   . Hypertension Neg Hx   . Kidney disease Neg Hx   . Learning disabilities Neg Hx   . Mental illness Neg Hx   . Mental retardation Neg Hx   . Miscarriages / Stillbirths Neg Hx   . Stroke Neg Hx   . Vision loss Neg Hx   . Varicose Veins Neg Hx    History  Substance Use Topics  . Smoking status: Never  Smoker   . Smokeless tobacco: Never Used  . Alcohol Use: No   OB History    Gravida Para Term Preterm AB TAB SAB Ectopic Multiple Living   1 1 1       1      Review of Systems  A complete 10 system review of systems was obtained and all systems are negative except as noted in the HPI and PMH.    Allergies  Review of patient's allergies indicates no known allergies.  Home Medications   Prior to Admission medications   Medication Sig Start Date End Date Taking? Authorizing Provider  Ferrous Fumarate (IRON) 18 MG TBCR Take 1 tablet (18 mg total) by mouth every morning. 01/01/14   Antionette CharLisa Jackson-Moore, MD  ibuprofen (ADVIL,MOTRIN) 600 MG tablet Take 1 tablet (600 mg total) by mouth every 6 (six) hours as needed. 01/08/14   Deirdre Colin Mulders Poe, CNM   BP 122/107 mmHg  Pulse 115  Temp(Src) 99.1 F (37.3 C)  Resp 18  SpO2 100%  LMP 09/29/2014  Breastfeeding? Yes Physical Exam  Constitutional: She is oriented to person, place, and time. She appears well-developed and well-nourished. No distress.  HENT:  Head: Normocephalic.  No abnormal phonation. No drooling or stridor. Posterior pharynx mildly erythematous no significant tonsillar hypertrophy. No exudate. Soft palate rises symmetrically. No TTP or induration under tongue.   No tenderness to palpation of frontal or bilateral maxillary sinuses.  No mucosal edema in the nares.  Bilateral tympanic membranes with normal architecture and good light reflex.    Eyes: Conjunctivae and EOM are normal. Pupils are equal, round, and reactive to light.  Neck: Normal range of motion. Neck supple.  Cardiovascular: Regular rhythm and intact distal pulses.   Mild tachycardia  Pulmonary/Chest: Effort normal and breath sounds normal. No stridor. No respiratory distress. She has no wheezes. She has no rales. She exhibits no tenderness.  Abdominal: Soft. Bowel sounds are normal. She exhibits no distension and no mass. There is no tenderness. There is no  rebound and no guarding.  Musculoskeletal: Normal range of motion.  Neurological: She is alert and oriented to person, place, and time.  Psychiatric: She has a normal mood and affect.  Nursing note and vitals reviewed.   ED Course  Procedures   DIAGNOSTIC STUDIES: Oxygen Saturation is 100% on RA, normal by my interpretation.    COORDINATION OF CARE: 10:48 PM Discussed treatment plan with pt at bedside and pt agreed to plan.   Labs Review Labs Reviewed  CBC WITH DIFFERENTIAL - Abnormal; Notable for the following:    WBC 12.5 (*)    Neutrophils Relative % 88 (*)    Neutro Abs 11.0 (*)    Lymphocytes Relative 6 (*)    All other components within normal limits  I-STAT CHEM 8, ED - Abnormal; Notable for the following:    Calcium, Ion 1.07 (*)    All other components within normal limits  RAPID STREP SCREEN  I-STAT BETA HCG BLOOD, ED ([facility], [facility], [facility] ONLY)  I-STAT CG4 LACTIC ACID, ED    Imaging Review No results found.   EKG Interpretation None      MDM   Final diagnoses:  Dehydration  Pharyngitis    Filed Vitals:   10/26/14 2212 10/27/14 0131  BP: 122/107 119/72  Pulse: 115 95  Temp: 99.1 F (37.3 C) 98.2 F (36.8 C)  TempSrc:  Oral  Resp: 18 18  SpO2: 100% 100%    Medications  lidocaine (XYLOCAINE) 2 % viscous mouth solution 20 mL (20 mLs Mouth/Throat Given 10/26/14 2311)  ibuprofen (ADVIL,MOTRIN) tablet 800 mg (800 mg Oral Given 10/26/14 2311)  sodium chloride 0.9 % bolus 1,000 mL (0 mLs Intravenous Stopped 10/27/14 0025)  ketorolac (TORADOL) 30 MG/ML injection 15 mg (15 mg Intravenous Given 10/27/14 0026)    Misty Curry is a pleasant 24 y.o. female presenting with rhinorrhea, pharyngitis, headache, decreased by mouth intake. No signs of PTA or retropharyngeal or abscess. Patient appears mildly dehydrated. She is also tachycardic, attempted to give patient viscous lidocaine which she vomited, stated the texture of the viscous lidocaine was distasteful to  her. We'll give IV fluids and reassess. Patient reports significant subjective improvement after first liter, will give second liter and by mouth challenge. Patient tolerating by mouth well.   Evaluation does not show pathology that would require ongoing emergent intervention or inpatient treatment. Pt is hemodynamically stable and mentating appropriately. Discussed findings and plan with patient/guardian, who agrees with care plan. All questions answered. Return precautions discussed and outpatient follow up given.   I personally performed the services described in this documentation, which was scribed in my presence. The recorded  information has been reviewed and is accurate.      Wynetta Emery, PA-C 10/27/14 0222  Doug Sou, MD 10/29/14 [phone]

## 2014-10-26 NOTE — ED Notes (Signed)
Pt. reports sore throat with occasional productive cough onset yesterday , denies fever , respirations unlabored.

## 2014-10-27 MED ORDER — KETOROLAC TROMETHAMINE 30 MG/ML IJ SOLN
15.0000 mg | Freq: Once | INTRAMUSCULAR | Status: AC
  Administered 2014-10-27: 15 mg via INTRAVENOUS
  Filled 2014-10-27: qty 1

## 2014-10-27 NOTE — Discharge Instructions (Signed)
Push fluids: take small frequent sips of water or Gatorade, do not drink any soda, juice or caffeinated beverages.    Slowly resume solid diet as desired. Avoid food that are spicy, contain dairy and/or have high fat content.  Aviod NSAIDs (aspirin, motrin, ibuprofen, naproxen, Aleve et Karie Sodacetera) for pain control because they will irritate your stomach.  Please follow with your primary care doctor in the next 2 days for a check-up. They must obtain records for further management.   Do not hesitate to return to the Emergency Department for any new, worsening or concerning symptoms.   Dehydration, Adult Dehydration means your body does not have as much fluid as it needs. Your kidneys, brain, and heart will not work properly without the right amount of fluids and salt.  HOME CARE  Ask your doctor how to replace body fluid losses (rehydrate).  Drink enough fluids to keep your pee (urine) clear or pale yellow.  Drink small amounts of fluids often if you feel sick to your stomach (nauseous) or throw up (vomit).  Eat like you normally do.  Avoid:  Foods or drinks high in sugar.  Bubbly (carbonated) drinks.  Juice.  Very hot or cold fluids.  Drinks with caffeine.  Fatty, greasy foods.  Alcohol.  Tobacco.  Eating too much.  Gelatin desserts.  Wash your hands to avoid spreading germs (bacteria, viruses).  Only take medicine as told by your doctor.  Keep all doctor visits as told. GET HELP RIGHT AWAY IF:   You cannot drink something without throwing up.  You get worse even with treatment.  Your vomit has blood in it or looks greenish.  Your poop (stool) has blood in it or looks black and tarry.  You have not peed in 6 to 8 hours.  You pee a small amount of very dark pee.  You have a fever.  You pass out (faint).  You have belly (abdominal) pain that gets worse or stays in one spot (localizes).  You have a rash, stiff neck, or bad headache.  You get easily  annoyed, sleepy, or are hard to wake up.  You feel weak, dizzy, or very thirsty. MAKE SURE YOU:   Understand these instructions.  Will watch your condition.  Will get help right away if you are not doing well or get worse. Document Released: 07/27/2009 Document Revised: 12/23/2011 Document Reviewed: 05/20/2011 [facility] Patient Information 2015 [place]. This information is not intended to replace advice given to you by your health care provider. Make sure you discuss any questions you have with your health care provider.

## 2014-10-27 NOTE — ED Notes (Signed)
Pt able to tolerate PO fluids.  

## 2014-10-29 LAB — CULTURE, GROUP A STREP

## 2014-12-05 ENCOUNTER — Encounter (HOSPITAL_COMMUNITY): Payer: Self-pay | Admitting: *Deleted

## 2014-12-05 ENCOUNTER — Emergency Department (HOSPITAL_COMMUNITY)
Admission: EM | Admit: 2014-12-05 | Discharge: 2014-12-05 | Disposition: A | Discharge status: Home / Self Care | Payer: Medicaid Other | Attending: Emergency Medicine | Admitting: Emergency Medicine

## 2014-12-05 DIAGNOSIS — R197 Diarrhea, unspecified: Secondary | ICD-10-CM | POA: Diagnosis not present

## 2014-12-05 DIAGNOSIS — Z3202 Encounter for pregnancy test, result negative: Secondary | ICD-10-CM | POA: Insufficient documentation

## 2014-12-05 DIAGNOSIS — R112 Nausea with vomiting, unspecified: Secondary | ICD-10-CM | POA: Diagnosis present

## 2014-12-05 DIAGNOSIS — Z79899 Other long term (current) drug therapy: Secondary | ICD-10-CM | POA: Insufficient documentation

## 2014-12-05 LAB — URINALYSIS, ROUTINE W REFLEX MICROSCOPIC
BILIRUBIN URINE: NEGATIVE
GLUCOSE, UA: NEGATIVE mg/dL
Ketones, ur: NEGATIVE mg/dL
Nitrite: NEGATIVE
PH: 6 (ref 5.0–8.0)
Protein, ur: 30 mg/dL — AB
Specific Gravity, Urine: 1.026 (ref 1.005–1.030)
UROBILINOGEN UA: 1 mg/dL (ref 0.0–1.0)

## 2014-12-05 LAB — COMPREHENSIVE METABOLIC PANEL
ALBUMIN: 3.8 g/dL (ref 3.5–5.2)
ALT: 13 U/L (ref 0–35)
AST: 17 U/L (ref 0–37)
Alkaline Phosphatase: 45 U/L (ref 39–117)
Anion gap: 8 (ref 5–15)
BUN: 12 mg/dL (ref 6–23)
CALCIUM: 8.7 mg/dL (ref 8.4–10.5)
CO2: 25 mmol/L (ref 19–32)
Chloride: 102 mmol/L (ref 96–112)
Creatinine, Ser: 0.85 mg/dL (ref 0.50–1.10)
GFR calc Af Amer: >90 mL/min (ref >90)
GFR calc non Af Amer: >90 mL/min (ref >90)
GLUCOSE: 90 mg/dL (ref 70–99)
Potassium: 3.3 mmol/L — ABNORMAL LOW (ref 3.5–5.1)
Sodium: 135 mmol/L (ref 135–145)
Total Bilirubin: 0.7 mg/dL (ref 0.3–1.2)
Total Protein: 7.4 g/dL (ref 6.0–8.3)

## 2014-12-05 LAB — CBC WITH DIFFERENTIAL/PLATELET
BASOS PCT: 0 % (ref 0–1)
Basophils Absolute: 0 10*3/uL (ref 0.0–0.1)
EOS ABS: 0 10*3/uL (ref 0.0–0.7)
EOS PCT: 1 % (ref 0–5)
HCT: 37.5 % (ref 36.0–46.0)
Hemoglobin: 12.3 g/dL (ref 12.0–15.0)
Lymphocytes Relative: 19 % (ref 12–46)
Lymphs Abs: 0.8 10*3/uL (ref 0.7–4.0)
MCH: 28.4 pg (ref 26.0–34.0)
MCHC: 32.8 g/dL (ref 30.0–36.0)
MCV: 86.6 fL (ref 78.0–100.0)
MONOS PCT: 10 % (ref 3–12)
Monocytes Absolute: 0.4 10*3/uL (ref 0.1–1.0)
NEUTROS ABS: 2.9 10*3/uL (ref 1.7–7.7)
Neutrophils Relative %: 70 % (ref 43–77)
Platelets: 263 10*3/uL (ref 150–400)
RBC: 4.33 MIL/uL (ref 3.87–5.11)
RDW: 12.5 % (ref 11.5–15.5)
WBC: 4.1 10*3/uL (ref 4.0–10.5)

## 2014-12-05 LAB — URINE MICROSCOPIC-ADD ON

## 2014-12-05 LAB — PREGNANCY, URINE: PREG TEST UR: NEGATIVE

## 2014-12-05 LAB — LIPASE, BLOOD: LIPASE: 28 U/L (ref 11–59)

## 2014-12-05 MED ORDER — METOCLOPRAMIDE HCL 10 MG PO TABS: 10.0000 mg | ORAL_TABLET | Freq: Three times a day (TID) | ORAL | Status: DC | PRN

## 2014-12-05 MED ORDER — DICYCLOMINE HCL 10 MG PO CAPS
10.0000 mg | ORAL_CAPSULE | Freq: Once | ORAL | Status: AC
  Administered 2014-12-05: 10 mg via ORAL
  Filled 2014-12-05: qty 1

## 2014-12-05 MED ORDER — ONDANSETRON 4 MG PO TBDP
ORAL_TABLET | ORAL | Status: AC
  Filled 2014-12-05: qty 2

## 2014-12-05 MED ORDER — ONDANSETRON 4 MG PO TBDP
8.0000 mg | ORAL_TABLET | Freq: Once | ORAL | Status: AC
  Administered 2014-12-05: 8 mg via ORAL

## 2014-12-05 MED ORDER — METOCLOPRAMIDE HCL 10 MG PO TABS
10.0000 mg | ORAL_TABLET | Freq: Once | ORAL | Status: AC
  Administered 2014-12-05: 10 mg via ORAL
  Filled 2014-12-05: qty 1

## 2014-12-05 MED ORDER — DICYCLOMINE HCL 20 MG PO TABS: 20.0000 mg | ORAL_TABLET | Freq: Two times a day (BID) | ORAL | Status: DC

## 2014-12-05 NOTE — ED Notes (Signed)
Pt reports n/v/d for three days. Pt states daughter had similar symptoms.

## 2014-12-05 NOTE — Discharge Instructions (Signed)
Take the prescribed medication as directed. Recommend gentle diet for the next 2448 hrs. and progress back to normal as tolerated. Return to the ED for new or worsening symptoms.

## 2014-12-05 NOTE — ED Notes (Signed)
Pt verbalizes understanding of d/c instructions and denies any further needs at this time. 

## 2014-12-05 NOTE — ED Provider Notes (Signed)
CSN: [id number]     Arrival date & time 12/05/14  1629 History   First MD Initiated Contact with Patient 12/05/14 1802     Chief Complaint  Patient presents with  . Emesis     (Consider location/radiation/quality/duration/timing/severity/associated sxs/prior Treatment) Patient is a 24 y.o. female presenting with vomiting. The history is provided by the patient and medical records.  Emesis Associated symptoms: abdominal pain and diarrhea      This is a 24 year old female with no significant past medical history presenting to the ED for abdominal cramping, nausea, vomiting, and diarrhea for the past 3 days. Her daughter sick at home with similar symptoms. Estimates approx 5 episodes of non-bloody, non-bilious emesis so far today.  She denies any melena or hematochezia.She has attempted to drink water and eat crackers today without success. She has tried Advil and Aleve, but unable to hold down medications. She denies any fever or chills. No urinary symptoms or vaginal complaints.  VSS on arrival.  Past Medical History  Diagnosis Date  . Medical history non-contributory    Past Surgical History  Procedure Laterality Date  . Hernia repair    . Mole removal     Family History  Problem Relation Age of Onset  . Alcohol abuse Neg Hx   . Asthma Neg Hx   . Arthritis Neg Hx   . Birth defects Neg Hx   . Cancer Neg Hx   . COPD Neg Hx   . Depression Neg Hx   . Diabetes Neg Hx   . Drug abuse Neg Hx   . Early death Neg Hx   . Hearing loss Neg Hx   . Heart disease Neg Hx   . Hyperlipidemia Neg Hx   . Hypertension Neg Hx   . Kidney disease Neg Hx   . Learning disabilities Neg Hx   . Mental illness Neg Hx   . Mental retardation Neg Hx   . Miscarriages / Stillbirths Neg Hx   . Stroke Neg Hx   . Vision loss Neg Hx   . Varicose Veins Neg Hx    History  Substance Use Topics  . Smoking status: Never Smoker   . Smokeless tobacco: Never Used  . Alcohol Use: No   OB History    Gravida  Para Term Preterm AB TAB SAB Ectopic Multiple Living   Review of Systems  Gastrointestinal: Positive for nausea, vomiting, abdominal pain and diarrhea.  All other systems reviewed and are negative.     Allergies  Review of patient's allergies indicates no known allergies.  Home Medications   Prior to Admission medications   Medication Sig Start Date End Date Taking? Authorizing Provider  ibuprofen (ADVIL,MOTRIN) 600 MG tablet Take 1 tablet (600 mg total) by mouth every 6 (six) hours as needed. 01/08/14  Yes Deirdre C Poe, CNM  Ferrous Fumarate (IRON) 18 MG TBCR Take 1 tablet (18 mg total) by mouth every morning. Patient not taking: Reported on 12/05/2014 01/01/14   Antionette Char, MD   BP 129/81 mmHg  Pulse 110  Temp(Src) 98.9 F (37.2 C) (Oral)  Resp 16  Ht  (1.651 m)  SpO2 98%  LMP 11/28/2014   Physical Exam  Constitutional: She is oriented to person, place, and time. She appears well-developed and well-nourished. No distress.  HENT:  Head: Normocephalic and atraumatic.  Mouth/Throat: Oropharynx is clear and moist.  Mucous membranes remain moist  Eyes: Conjunctivae and EOM are normal. Pupils are equal, round, and reactive to light.  Neck: Normal range of motion. Neck supple.  Cardiovascular: Normal rate, regular rhythm and normal heart sounds.   Pulmonary/Chest: Effort normal and breath sounds normal. No respiratory distress. She has no wheezes.  Abdominal: Soft. Bowel sounds are normal. There is no tenderness. There is no guarding.  Abdomen soft, nondistended, no focal tenderness or peritoneal signs  Musculoskeletal: Normal range of motion. She exhibits no edema.  Neurological: She is alert and oriented to person, place, and time.  Skin: Skin is warm and dry. She is not diaphoretic.  Psychiatric: She has a normal mood and affect.  Nursing note and vitals reviewed.   ED Course  Procedures (including critical care time) Labs Review Labs  Reviewed  COMPREHENSIVE METABOLIC PANEL - Abnormal; Notable for the following:    Potassium 3.3 (*)    All other components within normal limits  URINALYSIS, ROUTINE W REFLEX MICROSCOPIC - Abnormal; Notable for the following:    APPearance HAZY (*)    Hgb urine dipstick SMALL (*)    Protein, ur 30 (*)    Leukocytes, UA TRACE (*)    All other components within normal limits  URINE MICROSCOPIC-ADD ON - Abnormal; Notable for the following:    Squamous Epithelial / LPF FEW (*)    All other components within normal limits  CBC WITH DIFFERENTIAL/PLATELET  LIPASE, BLOOD  PREGNANCY, URINE    Imaging Review No results found.   EKG Interpretation None      MDM   Final diagnoses:  Nausea vomiting and diarrhea   24 year old female with likely viral gastroenteritis. She has had nausea, vomiting, and diarrhea for 3 days, her daughter sick with similar symptoms. On exam, patient is afebrile and nontoxic in appearance. Her abdominal exam is benign. Her mucous membranes remain moist and she does not appear overly dehydrated. She was given Zofran in the waiting room without significant relief. She was given additional Reglan and Bentyl with resolution of her symptoms. She has tolerated PO fluids without relief.  Discussed plan with patient, he/she acknowledged understanding and agreed with plan of care.  Return precautions given for new or worsening symptoms.  Garlon HatchetLisa M Heavin Sebree, PA-C 12/05/14 2018  Dione Boozeavid Glick, MD 12/05/14 [phone]

## 2016-06-22 ENCOUNTER — Encounter: Payer: Self-pay | Admitting: *Deleted

## 2016-06-22 LAB — LAB REPORT - SCANNED
Pap: NEGATIVE
Pap: NEGATIVE

## 2016-10-22 ENCOUNTER — Encounter (HOSPITAL_COMMUNITY): Payer: Self-pay

## 2016-10-22 ENCOUNTER — Emergency Department (HOSPITAL_COMMUNITY)
Admission: EM | Admit: 2016-10-22 | Discharge: 2016-10-22 | Disposition: A | Discharge status: Home / Self Care | Payer: Self-pay | Attending: Emergency Medicine | Admitting: Emergency Medicine

## 2016-10-22 ENCOUNTER — Emergency Department (HOSPITAL_COMMUNITY): Payer: Self-pay

## 2016-10-22 DIAGNOSIS — J40 Bronchitis, not specified as acute or chronic: Secondary | ICD-10-CM | POA: Insufficient documentation

## 2016-10-22 LAB — RAPID STREP SCREEN (MED CTR MEBANE ONLY): STREPTOCOCCUS, GROUP A SCREEN (DIRECT): NEGATIVE

## 2016-10-22 MED ORDER — PREDNISONE 20 MG PO TABS: 40.0000 mg | ORAL_TABLET | Freq: Every day | ORAL | 0 refills | Status: DC

## 2016-10-22 MED ORDER — BENZONATATE 100 MG PO CAPS: 100.0000 mg | ORAL_CAPSULE | Freq: Three times a day (TID) | ORAL | 0 refills | Status: DC

## 2016-10-22 MED ORDER — IPRATROPIUM-ALBUTEROL 0.5-2.5 (3) MG/3ML IN SOLN
3.0000 mL | Freq: Once | RESPIRATORY_TRACT | Status: AC
  Administered 2016-10-22: 3 mL via RESPIRATORY_TRACT
  Filled 2016-10-22: qty 3

## 2016-10-22 MED ORDER — ALBUTEROL SULFATE HFA 108 (90 BASE) MCG/ACT IN AERS: 1.0000 | INHALATION_SPRAY | Freq: Four times a day (QID) | RESPIRATORY_TRACT | 0 refills | Status: DC | PRN

## 2016-10-22 MED ORDER — PREDNISONE 20 MG PO TABS
60.0000 mg | ORAL_TABLET | Freq: Once | ORAL | Status: AC
  Administered 2016-10-22: 60 mg via ORAL
  Filled 2016-10-22: qty 3

## 2016-10-22 MED ORDER — BENZONATATE 100 MG PO CAPS
100.0000 mg | ORAL_CAPSULE | Freq: Once | ORAL | Status: AC
  Administered 2016-10-22: 100 mg via ORAL
  Filled 2016-10-22: qty 1

## 2016-10-22 NOTE — Discharge Instructions (Signed)
Her chest x-ray shows no signs of pneumonia. This is likely a viral bronchitis. Use the albuterol inhaler as needed for shortness of breath and cough. He may take Tessalon for cough. Take the prednisone 2 tablets a day for the next 3 days. May continue to use the Mucinex. Drink plenty of fluids stay hydrated. Follow up with her primary care doctor. Return to ED if you develope any worsening symptoms.

## 2016-10-22 NOTE — ED Provider Notes (Signed)
[facility]-EMERGENCY DEPT Provider Note   CSN: [id number] Arrival date & time: 10/22/16  1132  By signing my name below, I, Majel Homer, attest that this documentation has been prepared under the direction and in the presence of Demetrios Loll, PA-C . Electronically Signed: Majel Homer, Scribe. 10/22/2016. 2:34 PM.  History   Chief Complaint Chief Complaint  Patient presents with  . Cough   The history is provided by the patient. No language interpreter was used.   HPI Comments: Misty Curry is a 26 y.o. female with PMHx of bronchitis, who presents to the Emergency Department complaining of gradually worsening, sore throat and cough productive of green sputum that began ~1 month ago. Pt reports associated chest pain secondary to her cough that is exacerbated when taking a deep breath. She also notes associated rhinorrhea, ear pain, and generalized body aches. Pt reports she is from [place] and recently visited family in [place] and [place] and believes the "cold weather" could be attributed to her symptoms. She notes her daughter has also had similar symptoms and was recently diagnosed by her PCP with a "common cold." Pt notes she has used Mucinex for her symptoms with no relief. She denies fever, calf or leg swelling, use of contraceptive medication, and hx of PE, asthma, COPD, cardiac issues or smoking.   Past Medical History:  Diagnosis Date  . Medical history non-contributory     Patient Active Problem List   Diagnosis Date Noted  . NVD (normal vaginal delivery) 12/31/2013  . Active labor 12/30/2013    Past Surgical History:  Procedure Laterality Date  . HERNIA REPAIR    . MOLE REMOVAL      OB History    Gravida Para Term Preterm AB Living   1 1 1     1    SAB TAB Ectopic Multiple Live Births           1     Home Medications    Prior to Admission medications   Medication Sig Start Date End Date Taking? Authorizing Provider  dicyclomine (BENTYL) 20 MG  tablet Take 1 tablet (20 mg total) by mouth 2 (two) times daily. 12/05/14   Garlon Hatchet, PA-C  Ferrous Fumarate (IRON) 18 MG TBCR Take 1 tablet (18 mg total) by mouth every morning. Patient not taking: Reported on 12/05/2014 01/01/14   Antionette Char, MD  ibuprofen (ADVIL,MOTRIN) 600 MG tablet Take 1 tablet (600 mg total) by mouth every 6 (six) hours as needed. 01/08/14   Deirdre Colin Mulders, CNM  metoCLOPramide (REGLAN) 10 MG tablet Take 1 tablet (10 mg total) by mouth 3 (three) times daily as needed for nausea (headache / nausea). 12/05/14   Garlon Hatchet, PA-C    Family History Family History  Problem Relation Age of Onset  . Alcohol abuse Neg Hx   . Asthma Neg Hx   . Arthritis Neg Hx   . Birth defects Neg Hx   . Cancer Neg Hx   . COPD Neg Hx   . Depression Neg Hx   . Diabetes Neg Hx   . Drug abuse Neg Hx   . Early death Neg Hx   . Hearing loss Neg Hx   . Heart disease Neg Hx   . Hyperlipidemia Neg Hx   . Hypertension Neg Hx   . Kidney disease Neg Hx   . Learning disabilities Neg Hx   . Mental illness Neg Hx   . Mental retardation Neg Hx   .  Miscarriages / Stillbirths Neg Hx   . Stroke Neg Hx   . Vision loss Neg Hx   . Varicose Veins Neg Hx     Social History Social History  Substance Use Topics  . Smoking status: Never Smoker  . Smokeless tobacco: Never Used  . Alcohol use No     Allergies   Patient has no known allergies.   Review of Systems Review of Systems  Constitutional: Negative for fever.  HENT: Positive for ear pain, rhinorrhea and sore throat.   Respiratory: Positive for cough.   Cardiovascular: Positive for chest pain (secondary to cough). Negative for leg swelling.  Musculoskeletal: Positive for myalgias.  All other systems reviewed and are negative.  Physical Exam Updated Vital Signs BP 127/77 (BP Location: Left Arm)   Pulse 94   Temp 98.8 F (37.1 C) (Oral)   Resp 16   Ht 5\' 5"  (1.651 m)   LMP 09/24/2016   SpO2 100%   Breastfeeding? No     Physical Exam  Constitutional: She is oriented to person, place, and time. She appears well-developed and well-nourished.  HENT:  Head: Normocephalic and atraumatic.  Right Ear: Tympanic membrane, external ear and ear canal normal.  Left Ear: Tympanic membrane, external ear and ear canal normal.  Nose: Mucosal edema and rhinorrhea present. Right sinus exhibits no maxillary sinus tenderness and no frontal sinus tenderness. Left sinus exhibits no maxillary sinus tenderness and no frontal sinus tenderness.  Mouth/Throat: Uvula is midline, oropharynx is clear and moist and mucous membranes are normal.  Eyes: Conjunctivae and EOM are normal.  Neck: Normal range of motion. Neck supple.  Cardiovascular: Normal rate, regular rhythm, normal heart sounds and intact distal pulses.   Patient is not tachycardic.  Pulmonary/Chest: Effort normal and breath sounds normal. No respiratory distress. She has no wheezes. She exhibits tenderness.  Clear to auscultation bilaterally. Nonproductive cough noted  Abdominal: She exhibits no distension.  Musculoskeletal: Normal range of motion.  Lymphadenopathy:    She has no cervical adenopathy.  Neurological: She is alert and oriented to person, place, and time.  Skin: Skin is warm and dry. Capillary refill takes less than 2 seconds.  Psychiatric: She has a normal mood and affect.  Nursing note and vitals reviewed.  ED Treatments / Results  Labs (all labs ordered are listed, but only abnormal results are displayed) Labs Reviewed  RAPID STREP SCREEN (NOT AT [facility])  CULTURE, GROUP A STREP [facility])    EKG  EKG Interpretation None       Radiology Dg Chest 2 View  Result Date: 10/22/2016 CLINICAL DATA:  Left lower pleuritic chest pain. Exertional shortness of breath, chest congestion, productive cough, chills, and body aches for the past20 days. EXAM: CHEST  2 VIEW COMPARISON:  PA and lateral chest x-ray of May 13, 2008 FINDINGS: The lungs are mildly  hyperinflated with hemidiaphragm flattening. There is no focal infiltrate. There is no pleural effusion. The heart and pulmonary vascularity are normal. The mediastinum is normal in width. The trachea is midline. The bony thorax is unremarkable. IMPRESSION: Mild hyperinflation consistent with reactive airway disease. There is no pneumonia nor other acute cardiopulmonary abnormality. Electronically Signed   By: David  [place] M.D.   On: 10/22/2016 12:54    Procedures Procedures (including critical care time)  Medications Ordered in ED Medications  ipratropium-albuterol (DUONEB) 0.5-2.5 (3) MG/3ML nebulizer solution 3 mL (3 mLs Nebulization Given 10/22/16 1502)  predniSONE (DELTASONE) tablet 60 mg (60 mg Oral Given 10/22/16 1459)  benzonatate (TESSALON) capsule 100 mg (100 mg Oral Given 10/22/16 1459)    DIAGNOSTIC STUDIES:  Oxygen Saturation is 100% on RA, normal by my interpretation.    COORDINATION OF CARE:  2:18 PM Discussed treatment plan with pt at bedside and pt agreed to plan.  Initial Impression / Assessment and Plan / ED Course  I have reviewed the triage vital signs and the nursing notes.  Pertinent labs & imaging results that were available during my care of the patient were reviewed by me and considered in my medical decision making (see chart for details).  Clinical Course   Patient presents to the ED with several complaints including cough, rhinorrhea, chest pain secondary to cough. Chest x-ray shows no focal infiltrate however does show hyperinflation indicative of airway disease. Patient with history of bronchitis and states this feels similar. Will treat with DuoNeb, cough medicine, steroids. Patient is perc negative and Wells 0. Low suspicion for PE. Patient without any significant family history of heart disease. Low suspicion for ACS. Patient feels significantly improved after DuoNeb treatment. This is likely viral bronchitis. We will discharge home course of prednisone, cough  medicine and albuterol inhaler. Patient encouraged to follow up with her primary care doctor. Patient encouraged to return to the ED if she develops any exertional chest pain, worsening shortness of breath, hemoptysis, fever or for any other reason. Pt is hemodynamically stable, in NAD, & able to ambulate in the ED. Pain has been managed & has no complaints prior to dc. Pt is comfortable with above plan and is stable for discharge at this time. All questions were answered prior to disposition. Strict return precautions for f/u to the ED were discussed.  I personally performed the services described in this documentation, which was scribed in my presence. The recorded information has been reviewed and is accurate.   Final Clinical Impressions(s) / ED Diagnoses   Final diagnoses:  Bronchitis    New Prescriptions New Prescriptions   ALBUTEROL (PROVENTIL HFA;VENTOLIN HFA) 108 (90 BASE) MCG/ACT INHALER    Inhale 1-2 puffs into the lungs every 6 (six) hours as needed for wheezing or shortness of breath.   BENZONATATE (TESSALON) 100 MG CAPSULE    Take 1 capsule (100 mg total) by mouth every 8 (eight) hours.   PREDNISONE (DELTASONE) 20 MG TABLET    Take 2 tablets (40 mg total) by mouth daily with breakfast.     Rise MuKenneth T Crissie Aloi, PA-C 10/22/16 1525    Gerhard Munchobert Lockwood, MD 10/22/16 2038

## 2016-10-22 NOTE — ED Notes (Signed)
Pt states that she has had a cough since dec 20 , nhurts under her left breast and has had some phlegm, at first it was green and now it was yellow

## 2016-10-22 NOTE — ED Triage Notes (Signed)
Pt reports sore throat and a dry cough since Dec-20th and states it is getting worse from being in the [place] in [place]. She does report chest congestion but "only when I cough."

## 2016-10-24 LAB — CULTURE, GROUP A STREP (THRC)

## 2017-10-18 ENCOUNTER — Emergency Department (HOSPITAL_COMMUNITY)
Admission: EM | Admit: 2017-10-18 | Discharge: 2017-10-19 | Disposition: A | Discharge status: Home / Self Care | Payer: Self-pay | Attending: Emergency Medicine | Admitting: Emergency Medicine

## 2017-10-18 DIAGNOSIS — L089 Local infection of the skin and subcutaneous tissue, unspecified: Secondary | ICD-10-CM | POA: Insufficient documentation

## 2017-10-18 DIAGNOSIS — Z5321 Procedure and treatment not carried out due to patient leaving prior to being seen by health care provider: Secondary | ICD-10-CM | POA: Insufficient documentation

## 2017-10-18 NOTE — ED Notes (Signed)
Pt called from the lobby with no repsonse 

## 2017-10-19 ENCOUNTER — Encounter (HOSPITAL_BASED_OUTPATIENT_CLINIC_OR_DEPARTMENT_OTHER): Payer: Self-pay | Admitting: Emergency Medicine

## 2017-10-19 ENCOUNTER — Other Ambulatory Visit: Payer: Self-pay

## 2017-10-19 ENCOUNTER — Emergency Department (HOSPITAL_BASED_OUTPATIENT_CLINIC_OR_DEPARTMENT_OTHER)
Admission: EM | Admit: 2017-10-19 | Discharge: 2017-10-19 | Disposition: A | Discharge status: Home / Self Care | Payer: Self-pay | Attending: Emergency Medicine | Admitting: Emergency Medicine

## 2017-10-19 DIAGNOSIS — L02415 Cutaneous abscess of right lower limb: Secondary | ICD-10-CM | POA: Insufficient documentation

## 2017-10-19 DIAGNOSIS — L0291 Cutaneous abscess, unspecified: Secondary | ICD-10-CM

## 2017-10-19 MED ORDER — CEPHALEXIN 500 MG PO CAPS: 500.0000 mg | ORAL_CAPSULE | Freq: Four times a day (QID) | ORAL | 0 refills | Status: DC

## 2017-10-19 MED ORDER — SULFAMETHOXAZOLE-TRIMETHOPRIM 800-160 MG PO TABS: 1.0000 | ORAL_TABLET | Freq: Two times a day (BID) | ORAL | 0 refills | Status: AC

## 2017-10-19 NOTE — ED Triage Notes (Signed)
Abscess to R thigh

## 2017-10-19 NOTE — Discharge Instructions (Signed)
Take antibiotics until completed.  Continue caring for your wound as you have been.  It is clean and covered.  Please return to the emergency department if you develop any new or worsening symptoms including fever, increasing pain, redness, swelling, red streaking from the area.  The wound may continue to drink.

## 2017-10-20 NOTE — ED Provider Notes (Signed)
[facility] EMERGENCY DEPARTMENT Provider Note   CSN: [id number] Arrival date & time: 10/19/17  1650     History   Chief Complaint Chief Complaint  Patient presents with  . Abscess    HPI Misty Curry is a 27 y.o. female who presents with a 2-day history of abscess to right lateral thigh.  Patient reports she began having swelling to her leg 2 days ago.  She tried warm compress at home which brought the area to head which she squeezed and the area drained significantly.  It continues to drain and have tenderness and warmth.  She has not taken any medications at home for symptoms.  She denies fever or other symptoms.  She reports having an abscess in the past, but never having incision and drainage.  HPI  Past Medical History:  Diagnosis Date  . Medical history non-contributory     Patient Active Problem List   Diagnosis Date Noted  . NVD (normal vaginal delivery) 12/31/2013  . Active labor 12/30/2013    Past Surgical History:  Procedure Laterality Date  . HERNIA REPAIR    . MOLE REMOVAL      OB History    Gravida Para Term Preterm AB Living   1 1 1     1    SAB TAB Ectopic Multiple Live Births           1       Home Medications    Prior to Admission medications   Medication Sig Start Date End Date Taking? Authorizing Provider  albuterol (PROVENTIL HFA;VENTOLIN HFA) 108 (90 Base) MCG/ACT inhaler Inhale 1-2 puffs into the lungs every 6 (six) hours as needed for wheezing or shortness of breath. 10/22/16   Rise MuLeaphart, Kenneth T, PA-C  benzonatate (TESSALON) 100 MG capsule Take 1 capsule (100 mg total) by mouth every 8 (eight) hours. 10/22/16   Rise MuLeaphart, Kenneth T, PA-C  cephALEXin (KEFLEX) 500 MG capsule Take 1 capsule (500 mg total) by mouth 4 (four) times daily. 10/19/17   Markie Heffernan, Waylan BogaAlexandra M, PA-C  dicyclomine (BENTYL) 20 MG tablet Take 1 tablet (20 mg total) by mouth 2 (two) times daily. 12/05/14   Garlon HatchetSanders, Lisa M, PA-C  Ferrous Fumarate (IRON) 18 MG TBCR  Take 1 tablet (18 mg total) by mouth every morning. Patient not taking: Reported on 12/05/2014 01/01/14   Antionette CharJackson-Moore, Lisa, MD  ibuprofen (ADVIL,MOTRIN) 600 MG tablet Take 1 tablet (600 mg total) by mouth every 6 (six) hours as needed. 01/08/14   Poe, Deirdre C, CNM  metoCLOPramide (REGLAN) 10 MG tablet Take 1 tablet (10 mg total) by mouth 3 (three) times daily as needed for nausea (headache / nausea). 12/05/14   Garlon HatchetSanders, Lisa M, PA-C  predniSONE (DELTASONE) 20 MG tablet Take 2 tablets (40 mg total) by mouth daily with breakfast. 10/22/16   Leaphart, Lynann BeaverKenneth T, PA-C  sulfamethoxazole-trimethoprim (BACTRIM DS,SEPTRA DS) 800-160 MG tablet Take 1 tablet by mouth 2 (two) times daily for 7 days. 10/19/17 10/26/17  Emi HolesLaw, Airyana Sprunger M, PA-C    Family History Family History  Problem Relation Age of Onset  . Alcohol abuse Neg Hx   . Asthma Neg Hx   . Arthritis Neg Hx   . Birth defects Neg Hx   . Cancer Neg Hx   . COPD Neg Hx   . Depression Neg Hx   . Diabetes Neg Hx   . Drug abuse Neg Hx   . Early death Neg Hx   . Hearing loss Neg Hx   .  Heart disease Neg Hx   . Hyperlipidemia Neg Hx   . Hypertension Neg Hx   . Kidney disease Neg Hx   . Learning disabilities Neg Hx   . Mental illness Neg Hx   . Mental retardation Neg Hx   . Miscarriages / Stillbirths Neg Hx   . Stroke Neg Hx   . Vision loss Neg Hx   . Varicose Veins Neg Hx     Social History Social History   Tobacco Use  . Smoking status: Never Smoker  . Smokeless tobacco: Never Used  Substance Use Topics  . Alcohol use: No  . Drug use: No     Allergies   Patient has no known allergies.   Review of Systems Review of Systems  Constitutional: Negative for fever.  Skin: Positive for wound.     Physical Exam Updated Vital Signs BP 125/75   Pulse 76   Temp 98.2 F (36.8 C) (Oral)   Resp 20   Ht 5\' 3"  (1.6 m)   Wt 86.2 kg (190 lb)   LMP 09/13/2017   SpO2 100%   BMI 33.66 kg/m   Physical Exam  Constitutional: She  appears well-developed and well-nourished. No distress.  HENT:  Head: Normocephalic and atraumatic.  Mouth/Throat: Oropharynx is clear and moist. No oropharyngeal exudate.  Eyes: Conjunctivae are normal. Pupils are equal, round, and reactive to light. Right eye exhibits no discharge. Left eye exhibits no discharge. No scleral icterus.  Neck: Normal range of motion.  Cardiovascular: Normal rate, regular rhythm, normal heart sounds and intact distal pulses. Exam reveals no gallop and no friction rub.  No murmur heard. Pulmonary/Chest: Effort normal and breath sounds normal. No stridor. No respiratory distress. She has no wheezes. She has no rales.  Musculoskeletal: She exhibits no edema.  Neurological: She is alert. Coordination normal.  Skin: Skin is warm and dry. No rash noted. She is not diaphoretic. No pallor.  5 mm open, draining wound to right lateral thigh mid shaft; surrounding induration around 5 cm, mild erythema and warmth, tenderness  Psychiatric: She has a normal mood and affect.  Nursing note and vitals reviewed.    ED Treatments / Results  Labs (all labs ordered are listed, but only abnormal results are displayed) Labs Reviewed - No data to display  EKG  EKG Interpretation None       Radiology No results found.  Procedures Procedures (including critical care time)  Medications Ordered in ED Medications - No data to display   Initial Impression / Assessment and Plan / ED Course  I have reviewed the triage vital signs and the nursing notes.  Pertinent labs & imaging results that were available during my care of the patient were reviewed by me and considered in my medical decision making (see chart for details).     Patient with abscess that is actively draining.  Draining opening is large and I do not feel needs to be extended.  I was able to expel more purulent drainage flushed with normal saline.  Patient could not tolerate more than minimal blunt  dissection due to pain.  Considering large circular opening and small size of abscess space, packing placed today.  Will discharge home with Keflex and Bactrim for further treatment of infection.  Patient denies possibility of pregnancy.  Return precautions discussed.  Patient understands and agrees with plan.  Patient vitals stable throughout ED course and discharged in satisfactory condition.  Final Clinical Impressions(s) / ED Diagnoses   Final  diagnoses:  Abscess    ED Discharge Orders        Ordered    sulfamethoxazole-trimethoprim (BACTRIM DS,SEPTRA DS) 800-160 MG tablet  2 times daily     10/19/17 2155    cephALEXin (KEFLEX) 500 MG capsule  4 times daily     10/19/17 2155       Emi Holes, PA-C 10/20/17 0120    Tilden Fossa, MD 10/20/17 1446

## 2018-07-20 ENCOUNTER — Emergency Department (HOSPITAL_COMMUNITY)
Admission: EM | Admit: 2018-07-20 | Discharge: 2018-07-20 | Disposition: A | Discharge status: Home / Self Care | Payer: Self-pay | Attending: Emergency Medicine | Admitting: Emergency Medicine

## 2018-07-20 ENCOUNTER — Other Ambulatory Visit: Payer: Self-pay

## 2018-07-20 ENCOUNTER — Emergency Department (HOSPITAL_COMMUNITY): Payer: Self-pay

## 2018-07-20 ENCOUNTER — Encounter (HOSPITAL_COMMUNITY): Payer: Self-pay

## 2018-07-20 DIAGNOSIS — Z79899 Other long term (current) drug therapy: Secondary | ICD-10-CM | POA: Insufficient documentation

## 2018-07-20 DIAGNOSIS — J4541 Moderate persistent asthma with (acute) exacerbation: Secondary | ICD-10-CM | POA: Insufficient documentation

## 2018-07-20 DIAGNOSIS — D508 Other iron deficiency anemias: Secondary | ICD-10-CM

## 2018-07-20 DIAGNOSIS — D509 Iron deficiency anemia, unspecified: Secondary | ICD-10-CM | POA: Insufficient documentation

## 2018-07-20 DIAGNOSIS — R55 Syncope and collapse: Secondary | ICD-10-CM | POA: Insufficient documentation

## 2018-07-20 HISTORY — DX: Unspecified asthma, uncomplicated: J45.909

## 2018-07-20 LAB — URINALYSIS, ROUTINE W REFLEX MICROSCOPIC
BILIRUBIN URINE: NEGATIVE
Glucose, UA: NEGATIVE mg/dL
Ketones, ur: NEGATIVE mg/dL
NITRITE: NEGATIVE
PH: 6 (ref 5.0–8.0)
Protein, ur: NEGATIVE mg/dL
SPECIFIC GRAVITY, URINE: 1.016 (ref 1.005–1.030)

## 2018-07-20 LAB — BASIC METABOLIC PANEL
Anion gap: 10 (ref 5–15)
BUN: 8 mg/dL (ref 6–20)
CO2: 21 mmol/L — ABNORMAL LOW (ref 22–32)
CREATININE: 0.84 mg/dL (ref 0.44–1.00)
Calcium: 8.8 mg/dL — ABNORMAL LOW (ref 8.9–10.3)
Chloride: 105 mmol/L (ref 98–111)
GFR calc Af Amer: >60 mL/min (ref >60)
GLUCOSE: 89 mg/dL (ref 70–99)
Potassium: 3.1 mmol/L — ABNORMAL LOW (ref 3.5–5.1)
SODIUM: 136 mmol/L (ref 135–145)

## 2018-07-20 LAB — I-STAT BETA HCG BLOOD, ED (MC, WL, AP ONLY)

## 2018-07-20 LAB — CBC
HCT: 34 % — ABNORMAL LOW (ref 36.0–46.0)
Hemoglobin: 9.8 g/dL — ABNORMAL LOW (ref 12.0–15.0)
MCH: 22.9 pg — ABNORMAL LOW (ref 26.0–34.0)
MCHC: 28.8 g/dL — AB (ref 30.0–36.0)
MCV: 79.4 fL (ref 78.0–100.0)
PLATELETS: 440 10*3/uL — AB (ref 150–400)
RBC: 4.28 MIL/uL (ref 3.87–5.11)
RDW: 16 % — AB (ref 11.5–15.5)
WBC: 9.7 10*3/uL (ref 4.0–10.5)

## 2018-07-20 MED ORDER — MAGNESIUM OXIDE 400 (241.3 MG) MG PO TABS
800.0000 mg | ORAL_TABLET | Freq: Once | ORAL | Status: AC
  Administered 2018-07-20: 800 mg via ORAL
  Filled 2018-07-20: qty 2

## 2018-07-20 MED ORDER — IBUPROFEN 800 MG PO TABS: 800.0000 mg | ORAL_TABLET | Freq: Once | ORAL | Status: DC

## 2018-07-20 MED ORDER — DEXAMETHASONE 4 MG PO TABS
10.0000 mg | ORAL_TABLET | Freq: Once | ORAL | Status: AC
  Administered 2018-07-20: 10 mg via ORAL
  Filled 2018-07-20: qty 3

## 2018-07-20 MED ORDER — IBUPROFEN 800 MG PO TABS
800.0000 mg | ORAL_TABLET | Freq: Once | ORAL | Status: AC
  Administered 2018-07-20: 800 mg via ORAL
  Filled 2018-07-20: qty 1

## 2018-07-20 MED ORDER — POTASSIUM CHLORIDE CRYS ER 20 MEQ PO TBCR
40.0000 meq | EXTENDED_RELEASE_TABLET | Freq: Once | ORAL | Status: AC
  Administered 2018-07-20: 40 meq via ORAL
  Filled 2018-07-20: qty 2

## 2018-07-20 NOTE — Discharge Instructions (Signed)
Use your inhaler every 4 hours(6 puffs) while awake, return for sudden worsening shortness of breath, or if you need to use your inhaler more often.  ° °

## 2018-07-20 NOTE — ED Notes (Signed)
PT states understanding of care given, follow up care, and medication prescribed. PT ambulated from ED to car with a steady gait. 

## 2018-07-20 NOTE — ED Triage Notes (Signed)
Pt states that her asthma has been bothering her today, unrelieved by inhaler, pt has also had several dizzy spells today and feeling like she is going to pass out.

## 2018-07-20 NOTE — ED Provider Notes (Signed)
[facility] EMERGENCY DEPARTMENT Provider Note   CSN: [id number] Arrival date & time: 07/20/18  2017     History   Chief Complaint Chief Complaint  Patient presents with  . Asthma  . Near Syncope    HPI Misty Curry is a 27 y.o. female.  27 yo F with a chief complaint of an asthma attack.  The patient used her inhaler multiple times began to feel like her heart was racing started feeling tingly all over and then thought she might pass out.  She is also been having pain to the anterior chest which is worse with coughing moving or palpation.  She denies hemoptysis denies lower extremity edema denies trauma.  Denies fevers or chills.  Feels that her breathing is much better at this point.  The history is provided by the patient.  Asthma  This is a new problem. The current episode started 6 to 12 hours ago. The problem occurs constantly. The problem has been gradually improving. Associated symptoms include chest pain and shortness of breath. Pertinent negatives include no headaches. Nothing aggravates the symptoms. Nothing relieves the symptoms. She has tried nothing for the symptoms. The treatment provided no relief.  Near Syncope  Associated symptoms include chest pain and shortness of breath. Pertinent negatives include no headaches.    Past Medical History:  Diagnosis Date  . Asthma   . Medical history non-contributory     Patient Active Problem List   Diagnosis Date Noted  . NVD (normal vaginal delivery) 12/31/2013  . Active labor 12/30/2013    Past Surgical History:  Procedure Laterality Date  . HERNIA REPAIR    . MOLE REMOVAL       OB History    Gravida  1   Para  1   Term  1   Preterm      AB      Living  1     SAB      TAB      Ectopic      Multiple      Live Births  1            Home Medications    Prior to Admission medications   Medication Sig Start Date End Date Taking? Authorizing Provider  albuterol  (PROVENTIL HFA;VENTOLIN HFA) 108 (90 Base) MCG/ACT inhaler Inhale 1-2 puffs into the lungs every 6 (six) hours as needed for wheezing or shortness of breath. 10/22/16   Rise Mu, PA-C  benzonatate (TESSALON) 100 MG capsule Take 1 capsule (100 mg total) by mouth every 8 (eight) hours. 10/22/16   Rise Mu, PA-C  cephALEXin (KEFLEX) 500 MG capsule Take 1 capsule (500 mg total) by mouth 4 (four) times daily. 10/19/17   Law, Waylan Boga, PA-C  dicyclomine (BENTYL) 20 MG tablet Take 1 tablet (20 mg total) by mouth 2 (two) times daily. 12/05/14   Garlon Hatchet, PA-C  Ferrous Fumarate (IRON) 18 MG TBCR Take 1 tablet (18 mg total) by mouth every morning. Patient not taking: Reported on 12/05/2014 01/01/14   Antionette Char, MD  ibuprofen (ADVIL,MOTRIN) 600 MG tablet Take 1 tablet (600 mg total) by mouth every 6 (six) hours as needed. 01/08/14   Poe, Deirdre C, CNM  metoCLOPramide (REGLAN) 10 MG tablet Take 1 tablet (10 mg total) by mouth 3 (three) times daily as needed for nausea (headache / nausea). 12/05/14   Garlon Hatchet, PA-C  predniSONE (DELTASONE) 20 MG tablet Take 2 tablets (  40 mg total) by mouth daily with breakfast. 10/22/16   Leaphart, Lynann Beaver, PA-C    Family History Family History  Problem Relation Age of Onset  . Alcohol abuse Neg Hx   . Asthma Neg Hx   . Arthritis Neg Hx   . Birth defects Neg Hx   . Cancer Neg Hx   . COPD Neg Hx   . Depression Neg Hx   . Diabetes Neg Hx   . Drug abuse Neg Hx   . Early death Neg Hx   . Hearing loss Neg Hx   . Heart disease Neg Hx   . Hyperlipidemia Neg Hx   . Hypertension Neg Hx   . Kidney disease Neg Hx   . Learning disabilities Neg Hx   . Mental illness Neg Hx   . Mental retardation Neg Hx   . Miscarriages / Stillbirths Neg Hx   . Stroke Neg Hx   . Vision loss Neg Hx   . Varicose Veins Neg Hx     Social History Social History   Tobacco Use  . Smoking status: Never Smoker  . Smokeless tobacco: Never Used  Substance  Use Topics  . Alcohol use: No  . Drug use: No     Allergies   Patient has no known allergies.   Review of Systems Review of Systems  Constitutional: Negative for chills and fever.  HENT: Negative for congestion and rhinorrhea.   Eyes: Negative for redness and visual disturbance.  Respiratory: Positive for shortness of breath. Negative for wheezing.   Cardiovascular: Positive for chest pain and near-syncope. Negative for palpitations.  Gastrointestinal: Negative for nausea and vomiting.  Genitourinary: Negative for dysuria and urgency.  Musculoskeletal: Negative for arthralgias and myalgias.  Skin: Negative for pallor and wound.  Neurological: Positive for syncope (near). Negative for dizziness and headaches.     Physical Exam Updated Vital Signs BP 121/80   Pulse (!) 108   Temp 98.7 F (37.1 C) (Oral)   Resp 20   LMP 07/14/2018   SpO2 100%   Physical Exam  Constitutional: She is oriented to person, place, and time. She appears well-developed and well-nourished. No distress.  HENT:  Head: Normocephalic and atraumatic.  Eyes: Pupils are equal, round, and reactive to light. EOM are normal.  Neck: Normal range of motion. Neck supple.  Cardiovascular: Normal rate and regular rhythm. Exam reveals no gallop and no friction rub.  No murmur heard. Pulmonary/Chest: Effort normal. She has no wheezes. She has no rales. She exhibits tenderness (reproduces pain).  Abdominal: Soft. She exhibits no distension and no mass. There is no tenderness. There is no guarding.  Musculoskeletal: She exhibits no edema or tenderness.  Neurological: She is alert and oriented to person, place, and time.  Skin: Skin is warm and dry. She is not diaphoretic.  Psychiatric: She has a normal mood and affect. Her behavior is normal.  Nursing note and vitals reviewed.    ED Treatments / Results  Labs (all labs ordered are listed, but only abnormal results are displayed) Labs Reviewed  BASIC  METABOLIC PANEL - Abnormal; Notable for the following components:      Result Value   Potassium 3.1 (*)    CO2 21 (*)    Calcium 8.8 (*)    All other components within normal limits  CBC - Abnormal; Notable for the following components:   Hemoglobin 9.8 (*)    HCT 34.0 (*)    MCH 22.9 (*)  MCHC 28.8 (*)    RDW 16.0 (*)    Platelets 440 (*)    All other components within normal limits  URINALYSIS, ROUTINE W REFLEX MICROSCOPIC - Abnormal; Notable for the following components:   APPearance HAZY (*)    Hgb urine dipstick SMALL (*)    Leukocytes, UA MODERATE (*)    Bacteria, UA RARE (*)    All other components within normal limits  I-STAT BETA HCG BLOOD, ED ([facility], [facility], [facility] ONLY)    EKG EKG Interpretation  Date/Time:  Monday July 20 2018 20:24:14 EDT Ventricular Rate:  95 PR Interval:  132 QRS Duration: 76 QT Interval:  368 QTC Calculation: 462 R Axis:   76 Text Interpretation:  Normal sinus rhythm with sinus arrhythmia Right atrial enlargement Cannot rule out Anterior infarct , age undetermined Abnormal ECG no wpw, prolonged QT, or brugada Confirmed by Melene Plan [phone]) on 07/20/2018 8:28:28 PM   Radiology Dg Chest 2 View  Result Date: 07/20/2018 CLINICAL DATA:  Cough and shortness of breath EXAM: CHEST - 2 VIEW COMPARISON:  October 22, 2016 FINDINGS: Lungs are clear. Heart size and pulmonary vascularity are normal. No adenopathy. No evident bone lesions. IMPRESSION: No edema or consolidation. Electronically Signed   By: Bretta Bang III M.D.   On: 07/20/2018 21:42    Procedures Procedures (including critical care time)  Medications Ordered in ED Medications  dexamethasone (DECADRON) tablet 10 mg (has no administration in time range)  ibuprofen (ADVIL,MOTRIN) tablet 800 mg (has no administration in time range)  potassium chloride SA (K-DUR,KLOR-CON) CR tablet 40 mEq (has no administration in time range)  magnesium oxide (MAG-OX) tablet 800 mg (has no administration  in time range)     Initial Impression / Assessment and Plan / ED Course  I have reviewed the triage vital signs and the nursing notes.  Pertinent labs & imaging results that were available during my care of the patient were reviewed by me and considered in my medical decision making (see chart for details).     27 yo F with a chief complaint of shortness of breath chest pain and coughing.  Most likely the patient had an asthma exacerbation which albuterol made her heart race and then she felt like she was going to pass out.  She is complaining of chest pain is reproducible on exam.  EKG with no concerning findings.  Will obtain a chest x-ray basic lab work.  Does a steroids for asthma exacerbation.  Clear lungs on my exam.  I suspect her tachycardia is likely due to albuterol administration.  Hbg 9.8, below prior values though most recent 3 years ago.  Most likely this is iron deficiency based on her MCV.  K 3.1, will replete.    CXR without focal infiltrate as viewed by me.  D/c home.   9:46 PM:  I have discussed the diagnosis/risks/treatment options with the patient and believe the pt to be eligible for discharge home to follow-up with PCP. We also discussed returning to the ED immediately if new or worsening sx occur. We discussed the sx which are most concerning (e.g., sudden worsening pain, fever, inability to tolerate by mouth) that necessitate immediate return. Medications administered to the patient during their visit and any new prescriptions provided to the patient are listed below.  Medications given during this visit Medications  dexamethasone (DECADRON) tablet 10 mg (has no administration in time range)  ibuprofen (ADVIL,MOTRIN) tablet 800 mg (has no administration in time range)  potassium  chloride SA (K-DUR,KLOR-CON) CR tablet 40 mEq (has no administration in time range)  magnesium oxide (MAG-OX) tablet 800 mg (has no administration in time range)      The patient appears  reasonably screen and/or stabilized for discharge and I doubt any other medical condition or other [facility] requiring further screening, evaluation, or treatment in the ED at this time prior to discharge.      Final Clinical Impressions(s) / ED Diagnoses   Final diagnoses:  Moderate persistent asthma with exacerbation  Near syncope  Iron deficiency anemia secondary to inadequate dietary iron intake    ED Discharge Orders    None       Melene Plan, DO 07/20/18 2146

## 2020-07-04 ENCOUNTER — Ambulatory Visit (HOSPITAL_COMMUNITY)
Admission: EM | Admit: 2020-07-04 | Discharge: 2020-07-04 | Disposition: A | Discharge status: Home / Self Care | Payer: Self-pay | Attending: Family Medicine | Admitting: Family Medicine

## 2020-07-04 ENCOUNTER — Other Ambulatory Visit: Payer: Self-pay

## 2020-07-04 ENCOUNTER — Encounter (HOSPITAL_COMMUNITY): Payer: Self-pay | Admitting: Emergency Medicine

## 2020-07-04 DIAGNOSIS — J4520 Mild intermittent asthma, uncomplicated: Secondary | ICD-10-CM

## 2020-07-04 MED ORDER — CETIRIZINE HCL 10 MG PO TABS: 10.0000 mg | ORAL_TABLET | Freq: Every day | ORAL | 0 refills | Status: DC

## 2020-07-04 MED ORDER — ALBUTEROL SULFATE HFA 108 (90 BASE) MCG/ACT IN AERS: 1.0000 | INHALATION_SPRAY | Freq: Four times a day (QID) | RESPIRATORY_TRACT | 1 refills | Status: DC | PRN

## 2020-07-04 NOTE — Discharge Instructions (Addendum)
Medication as prescribed Follow up as needed for continued or worsening symptoms  

## 2020-07-04 NOTE — ED Notes (Signed)
Called pt to come in and get registered

## 2020-07-04 NOTE — ED Provider Notes (Signed)
[facility]    CSN: [id number] Arrival date & time: 07/04/20  6644      History   Chief Complaint Chief Complaint  Patient presents with  . Asthma  . Medication Refill    HPI Misty Curry is a 29 y.o. female.   Patient is 29 year old female presents today for asthma exacerbation.  Reporting that the past 2 days she has had some mild shortness of breath, coughing and wheezing.  Typical with season change.  Does not take allergy medicine.  Is out of her albuterol inhaler.  No fever, chills, body aches, sore throat, ear pain.  Has been using honey to treat.     Past Medical History:  Diagnosis Date  . Asthma   . Medical history non-contributory     Patient Active Problem List   Diagnosis Date Noted  . NVD (normal vaginal delivery) 12/31/2013  . Active labor 12/30/2013    Past Surgical History:  Procedure Laterality Date  . HERNIA REPAIR    . MOLE REMOVAL      OB History    Gravida  1   Para  1   Term  1   Preterm      AB      Living  1     SAB      TAB      Ectopic      Multiple      Live Births  1            Home Medications    Prior to Admission medications   Medication Sig Start Date End Date Taking? Authorizing Provider  albuterol (VENTOLIN HFA) 108 (90 Base) MCG/ACT inhaler Inhale 1-2 puffs into the lungs every 6 (six) hours as needed for wheezing or shortness of breath. 07/04/20   Domenic Schoenberger A, NP  cetirizine (ZYRTEC) 10 MG tablet Take 1 tablet (10 mg total) by mouth daily. 07/04/20   [name] A, NP  dicyclomine (BENTYL) 20 MG tablet Take 1 tablet (20 mg total) by mouth 2 (two) times daily. 12/05/14 07/04/20  Garlon Hatchet, PA-C  Ferrous Fumarate (IRON) 18 MG TBCR Take 1 tablet (18 mg total) by mouth every morning. Patient not taking: Reported on 12/05/2014 01/01/14 07/04/20  Antionette Char, MD  metoCLOPramide (REGLAN) 10 MG tablet Take 1 tablet (10 mg total) by mouth 3 (three) times daily as needed for nausea  (headache / nausea). 12/05/14 07/04/20  Garlon Hatchet, PA-C    Family History Family History  Problem Relation Age of Onset  . Alcohol abuse Neg Hx   . Asthma Neg Hx   . Arthritis Neg Hx   . Birth defects Neg Hx   . Cancer Neg Hx   . COPD Neg Hx   . Depression Neg Hx   . Diabetes Neg Hx   . Drug abuse Neg Hx   . Early death Neg Hx   . Hearing loss Neg Hx   . Heart disease Neg Hx   . Hyperlipidemia Neg Hx   . Hypertension Neg Hx   . Kidney disease Neg Hx   . Learning disabilities Neg Hx   . Mental illness Neg Hx   . Mental retardation Neg Hx   . Miscarriages / Stillbirths Neg Hx   . Stroke Neg Hx   . Vision loss Neg Hx   . Varicose Veins Neg Hx     Social History Social History   Tobacco Use  . Smoking status: Never Smoker  .  Smokeless tobacco: Never Used  Substance Use Topics  . Alcohol use: No  . Drug use: No     Allergies   Patient has no known allergies.   Review of Systems Review of Systems   Physical Exam Triage Vital Signs ED Triage Vitals  Enc Vitals Group     BP 07/04/20 0942 135/87     Pulse Rate 07/04/20 0942 80     Resp 07/04/20 0942 16     Temp 07/04/20 0942 98.7 F (37.1 C)     Temp Source 07/04/20 0942 Oral     SpO2 07/04/20 0942 99 %     Weight --      Height --      Head Circumference --      Peak Flow --      Pain Score 07/04/20 0939 0     Pain Loc --      Pain Edu? --      Excl. in GC? --    No data found.  Updated Vital Signs BP 135/87 (BP Location: Right Arm)   Pulse 80   Temp 98.7 F (37.1 C) (Oral)   Resp 16   LMP 05/22/2020   SpO2 99%   Visual Acuity Right Eye Distance:   Left Eye Distance:   Bilateral Distance:    Right Eye Near:   Left Eye Near:    Bilateral Near:     Physical Exam Vitals and nursing note reviewed.  Constitutional:      General: She is not in acute distress.    Appearance: Normal appearance. She is not ill-appearing, toxic-appearing or diaphoretic.  HENT:     Head: Normocephalic.       Nose: Nose normal.  Eyes:     Conjunctiva/sclera: Conjunctivae normal.  Cardiovascular:     Rate and Rhythm: Normal rate and regular rhythm.  Pulmonary:     Effort: Pulmonary effort is normal.     Breath sounds: Normal breath sounds.  Musculoskeletal:        General: Normal range of motion.     Cervical back: Normal range of motion.  Skin:    General: Skin is warm and dry.     Findings: No rash.  Neurological:     Mental Status: She is alert.  Psychiatric:        Mood and Affect: Mood normal.      UC Treatments / Results  Labs (all labs ordered are listed, but only abnormal results are displayed) Labs Reviewed - No data to display  EKG   Radiology No results found.  Procedures Procedures (including critical care time)  Medications Ordered in UC Medications - No data to display  Initial Impression / Assessment and Plan / UC Course  I have reviewed the triage vital signs and the nursing notes.  Pertinent labs & imaging results that were available during my care of the patient were reviewed by me and considered in my medical decision making (see chart for details).     Mild intermittent asthma without complication. Lungs clear on exam today.  Will refill her albuterol inhaler as requested to use as needed.  Recommended Zyrtec daily for allergies. Follow up as needed for continued or worsening symptoms  Final Clinical Impressions(s) / UC Diagnoses   Final diagnoses:  Mild intermittent asthma without complication     Discharge Instructions     Medication as prescribed. Follow up as needed for continued or worsening symptoms     ED Prescriptions  Medication Sig Dispense Auth. Provider   albuterol (VENTOLIN HFA) 108 (90 Base) MCG/ACT inhaler Inhale 1-2 puffs into the lungs every 6 (six) hours as needed for wheezing or shortness of breath. 1 each Arlow Spiers A, NP   cetirizine (ZYRTEC) 10 MG tablet Take 1 tablet (10 mg total) by mouth daily. 30  tablet [name] A, NP     PDMP not reviewed this encounter.   [name] A, NP 07/04/20 1019

## 2020-07-04 NOTE — ED Triage Notes (Signed)
Pt c/o asthma exacerbation lately. She states this morning while getting ready she felt short of breath. She states the weather changes usually make her asthma worse. Pt states she has not had her inhaler for a couple of months because she has been using honey.

## 2020-10-09 ENCOUNTER — Other Ambulatory Visit: Payer: Self-pay

## 2020-10-09 ENCOUNTER — Emergency Department (HOSPITAL_COMMUNITY)
Admission: EM | Admit: 2020-10-09 | Discharge: 2020-10-09 | Disposition: A | Discharge status: Home / Self Care | Payer: HRSA Program | Attending: Emergency Medicine | Admitting: Emergency Medicine

## 2020-10-09 DIAGNOSIS — Z5321 Procedure and treatment not carried out due to patient leaving prior to being seen by health care provider: Secondary | ICD-10-CM | POA: Insufficient documentation

## 2020-10-09 DIAGNOSIS — R519 Headache, unspecified: Secondary | ICD-10-CM | POA: Diagnosis present

## 2020-10-09 DIAGNOSIS — U071 COVID-19: Secondary | ICD-10-CM | POA: Diagnosis not present

## 2020-10-09 LAB — RESP PANEL BY RT-PCR (FLU A&B, COVID) ARPGX2
Influenza A by PCR: NEGATIVE
Influenza B by PCR: NEGATIVE
SARS Coronavirus 2 by RT PCR: POSITIVE — AB

## 2020-10-10 ENCOUNTER — Telehealth: Payer: Self-pay | Admitting: Unknown Physician Specialty

## 2020-10-10 NOTE — Telephone Encounter (Signed)
Called to Discuss with patient about Covid symptoms and the use of the monoclonal antibody infusion for those with mild to moderate Covid symptoms and at a high risk of hospitalization.     Pt appears to qualify for this infusion due to co-morbid conditions and/or a member of an at-risk group in accordance with the FDA Emergency Use Authorization.    Hung up on me

## 2021-01-09 ENCOUNTER — Other Ambulatory Visit: Payer: Self-pay

## 2021-01-09 ENCOUNTER — Ambulatory Visit (HOSPITAL_COMMUNITY)
Admission: EM | Admit: 2021-01-09 | Discharge: 2021-01-09 | Disposition: A | Discharge status: Home / Self Care | Payer: Medicaid Other | Attending: Urgent Care | Admitting: Urgent Care

## 2021-01-09 DIAGNOSIS — F1290 Cannabis use, unspecified, uncomplicated: Secondary | ICD-10-CM

## 2021-01-09 DIAGNOSIS — J4530 Mild persistent asthma, uncomplicated: Secondary | ICD-10-CM

## 2021-01-09 MED ORDER — ALBUTEROL SULFATE HFA 108 (90 BASE) MCG/ACT IN AERS: 1.0000 | INHALATION_SPRAY | Freq: Four times a day (QID) | RESPIRATORY_TRACT | 0 refills | Status: DC | PRN

## 2021-01-09 NOTE — ED Provider Notes (Signed)
[facility]   MRN: [id number] DOB: [birth year]  Subjective:   Misty Curry is a 30 y.o. female presenting for acute onset this morning of shortness of breath, wheezing, chest tightness and spasms.  Patient has no history of asthma.  Smokes marijuana twice daily every day.  Denies fever, runny or stuffy nose, cough, chest pain, belly pain, body aches.  No current facility-administered medications for this encounter.  Current Outpatient Medications:  .  albuterol (VENTOLIN HFA) 108 (90 Base) MCG/ACT inhaler, Inhale 1-2 puffs into the lungs every 6 (six) hours as needed for wheezing or shortness of breath., Disp: 1 each, Rfl: 1 .  cetirizine (ZYRTEC) 10 MG tablet, Take 1 tablet (10 mg total) by mouth daily., Disp: 30 tablet, Rfl: 0   No Known Allergies  Past Medical History:  Diagnosis Date  . Asthma   . Medical history non-contributory      Past Surgical History:  Procedure Laterality Date  . HERNIA REPAIR    . MOLE REMOVAL      Family History  Problem Relation Age of Onset  . Alcohol abuse Neg Hx   . Asthma Neg Hx   . Arthritis Neg Hx   . Birth defects Neg Hx   . Cancer Neg Hx   . COPD Neg Hx   . Depression Neg Hx   . Diabetes Neg Hx   . Drug abuse Neg Hx   . Early death Neg Hx   . Hearing loss Neg Hx   . Heart disease Neg Hx   . Hyperlipidemia Neg Hx   . Hypertension Neg Hx   . Kidney disease Neg Hx   . Learning disabilities Neg Hx   . Mental illness Neg Hx   . Mental retardation Neg Hx   . Miscarriages / Stillbirths Neg Hx   . Stroke Neg Hx   . Vision loss Neg Hx   . Varicose Veins Neg Hx     Social History   Tobacco Use  . Smoking status: Never Smoker  . Smokeless tobacco: Never Used  Substance Use Topics  . Alcohol use: No  . Drug use: No    ROS   Objective:   Vitals: BP 131/84 (BP Location: Right Arm)   Pulse 89   Temp 98.6 F (37 C) (Oral)   Resp 16   LMP 12/11/2020   SpO2 98%   Physical  Exam Constitutional:      General: She is not in acute distress.    Appearance: Normal appearance. She is well-developed. She is not ill-appearing, toxic-appearing or diaphoretic.  HENT:     Head: Normocephalic and atraumatic.     Nose: Nose normal.     Mouth/Throat:     Mouth: Mucous membranes are moist.  Eyes:     Extraocular Movements: Extraocular movements intact.     Pupils: Pupils are equal, round, and reactive to light.  Cardiovascular:     Rate and Rhythm: Normal rate and regular rhythm.     Pulses: Normal pulses.     Heart sounds: Normal heart sounds. No murmur heard. No friction rub. No gallop.   Pulmonary:     Effort: Pulmonary effort is normal. No respiratory distress.     Breath sounds: Normal breath sounds. No stridor. No wheezing, rhonchi or rales.  Skin:    General: Skin is warm and dry.     Findings: No rash.  Neurological:     Mental Status: She is alert and oriented  to person, place, and time.  Psychiatric:        Mood and Affect: Mood normal.        Behavior: Behavior normal.        Thought Content: Thought content normal.      Assessment and Plan :   PDMP not reviewed this encounter.  1. Mild persistent asthma without complication     Will defer Covid testing given known history of asthma, lack of "sick" symptoms, clear cardiopulmonary exam.  Refilled her albuterol encouraged patient to quit smoking marijuana.  Follow-up with PCP. Counseled patient on potential for adverse effects with medications prescribed/recommended today, ER and return-to-clinic precautions discussed, patient verbalized understanding.    Wallis Bamberg, [place] 01/09/21 [phone]

## 2021-01-09 NOTE — ED Triage Notes (Signed)
Pt reports waking up today with asthma problems . Pt needs a refill on HHN.

## 2021-01-11 ENCOUNTER — Other Ambulatory Visit: Payer: Self-pay

## 2021-01-11 ENCOUNTER — Encounter (HOSPITAL_COMMUNITY): Payer: Self-pay

## 2021-01-11 ENCOUNTER — Emergency Department (HOSPITAL_COMMUNITY)
Admission: EM | Admit: 2021-01-11 | Discharge: 2021-01-11 | Disposition: A | Discharge status: Home / Self Care | Payer: Medicaid Other | Attending: Emergency Medicine | Admitting: Emergency Medicine

## 2021-01-11 ENCOUNTER — Ambulatory Visit (HOSPITAL_COMMUNITY)
Admission: EM | Admit: 2021-01-11 | Discharge: 2021-01-11 | Disposition: A | Discharge status: Home / Self Care | Payer: Medicaid Other | Attending: Family Medicine | Admitting: Family Medicine

## 2021-01-11 DIAGNOSIS — J45909 Unspecified asthma, uncomplicated: Secondary | ICD-10-CM | POA: Insufficient documentation

## 2021-01-11 DIAGNOSIS — R0602 Shortness of breath: Secondary | ICD-10-CM | POA: Diagnosis present

## 2021-01-11 DIAGNOSIS — Z5321 Procedure and treatment not carried out due to patient leaving prior to being seen by health care provider: Secondary | ICD-10-CM | POA: Diagnosis not present

## 2021-01-11 MED ORDER — ALBUTEROL SULFATE HFA 108 (90 BASE) MCG/ACT IN AERS
2.0000 | INHALATION_SPRAY | Freq: Once | RESPIRATORY_TRACT | Status: AC
  Administered 2021-01-11: 2 via RESPIRATORY_TRACT

## 2021-01-11 MED ORDER — ALBUTEROL SULFATE HFA 108 (90 BASE) MCG/ACT IN AERS
INHALATION_SPRAY | RESPIRATORY_TRACT | Status: AC
Start: 2021-01-11 — End: ?
  Filled 2021-01-11: qty 6.7

## 2021-01-11 NOTE — ED Notes (Signed)
Pt called to be roomed, no response x1 

## 2021-01-11 NOTE — ED Notes (Signed)
Patient is being discharged from the Urgent Care and sent to the Emergency Department via POV . Per Margit Hanks, patient is in need of higher level of care due to asthma attack. Patient is aware and verbalizes understanding of plan of care.  Vitals:   01/11/21 2002 01/11/21 2006  BP:  119/87  Pulse: (!) 135   SpO2: 97%

## 2021-01-11 NOTE — ED Triage Notes (Signed)
Pt reports increased asthma problems, reports home inhaler has helped some with cough.

## 2021-01-11 NOTE — ED Notes (Signed)
Pt called to be roomed, no response x2 

## 2021-01-11 NOTE — ED Notes (Signed)
Albuterol scanned but not given to pt. The pt had 4 puffs at once within the hour. Reported to Misty Curry, [place].

## 2021-01-13 NOTE — ED Provider Notes (Signed)
Patient appears to have left without being seen by a provider.    Kahil Agner, Barbara Cower, MD 01/13/21 [phone]

## 2021-01-16 ENCOUNTER — Inpatient Hospital Stay (HOSPITAL_COMMUNITY)
Admission: AD | Admit: 2021-01-16 | Discharge: 2021-01-16 | Disposition: A | Discharge status: Home / Self Care | Payer: Medicaid Other | Attending: Obstetrics & Gynecology | Admitting: Obstetrics & Gynecology

## 2021-01-16 ENCOUNTER — Other Ambulatory Visit: Payer: Self-pay

## 2021-01-16 DIAGNOSIS — N912 Amenorrhea, unspecified: Secondary | ICD-10-CM | POA: Insufficient documentation

## 2021-01-16 NOTE — MAU Provider Note (Signed)
Ms.Dametra N Oliphant is a 30 y.o. G1P1001 at Unknown who presents to MAU today for pregnancy verification. The patient denies abdominal pain or vaginal bleeding today.   BP 124/70 (BP Location: Right Arm)   Pulse 94   Temp 98.3 F (36.8 C) (Oral)   Resp 16   Ht 5' 4.5" (1.638 m)   Wt 85.7 kg   LMP 12/08/2020   SpO2 100% Comment: room air  BMI 31.92 kg/m   CONSTITUTIONAL: Well-developed, well-nourished female in no acute distress.  MUSCULOSKELETAL: Normal range of motion.  CARDIOVASCULAR: Regular heart rate RESPIRATORY: Normal effort NEUROLOGICAL: Alert and oriented to person, place, and time.  SKIN: No pallor. PSYCH: Normal mood and affect. Normal behavior. Normal judgment and thought content.  No results found for this or any previous visit (from the past 24 hour(s)).  MDM Patient advised that without concerning symptoms today UPT will not be performed in MAU at this time  A: Amenorrhea  P: Discharge home Pt advised that routine pregnancy tests are offered at all [facility] offices Patient may return to MAU as needed or if her condition were to change or worsen   Donette Larry, [place]  01/16/2021 8:13 AM

## 2021-01-16 NOTE — MAU Note (Signed)
Misty Curry is a 30 y.o. here in MAU reporting: had + UPT last Friday. Denies pain and bleeding. Having some white discharge. Here for pregnancy confirmation.  LMP: 12/08/20  Onset of complaint: ongoing  Pain score: 0/10  Vitals:   01/16/21 0804  BP: 124/70  Pulse: 94  Resp: 16  Temp: 98.3 F (36.8 C)  SpO2: 100%     Lab orders placed from triage: none

## 2021-01-26 ENCOUNTER — Other Ambulatory Visit: Payer: Self-pay

## 2021-01-26 ENCOUNTER — Inpatient Hospital Stay (HOSPITAL_COMMUNITY): Payer: Medicaid Other

## 2021-01-26 ENCOUNTER — Inpatient Hospital Stay (HOSPITAL_COMMUNITY)
Admission: AD | Admit: 2021-01-26 | Discharge: 2021-01-26 | Disposition: A | Discharge status: Home / Self Care | Payer: Medicaid Other | Attending: Obstetrics & Gynecology | Admitting: Obstetrics & Gynecology

## 2021-01-26 ENCOUNTER — Encounter (HOSPITAL_COMMUNITY): Payer: Self-pay | Admitting: Obstetrics & Gynecology

## 2021-01-26 DIAGNOSIS — O26891 Other specified pregnancy related conditions, first trimester: Secondary | ICD-10-CM | POA: Diagnosis present

## 2021-01-26 DIAGNOSIS — M549 Dorsalgia, unspecified: Secondary | ICD-10-CM | POA: Insufficient documentation

## 2021-01-26 DIAGNOSIS — Z3A01 Less than 8 weeks gestation of pregnancy: Secondary | ICD-10-CM | POA: Insufficient documentation

## 2021-01-26 DIAGNOSIS — O209 Hemorrhage in early pregnancy, unspecified: Secondary | ICD-10-CM | POA: Diagnosis not present

## 2021-01-26 DIAGNOSIS — O0931 Supervision of pregnancy with insufficient antenatal care, first trimester: Secondary | ICD-10-CM | POA: Diagnosis not present

## 2021-01-26 DIAGNOSIS — Z3490 Encounter for supervision of normal pregnancy, unspecified, unspecified trimester: Secondary | ICD-10-CM

## 2021-01-26 LAB — URINALYSIS, ROUTINE W REFLEX MICROSCOPIC
Bilirubin Urine: NEGATIVE
Glucose, UA: NEGATIVE mg/dL
Ketones, ur: 5 mg/dL — AB
Nitrite: NEGATIVE
Protein, ur: 100 mg/dL — AB
Specific Gravity, Urine: 1.026 (ref 1.005–1.030)
pH: 6 (ref 5.0–8.0)

## 2021-01-26 LAB — CBC
HCT: 42.2 % (ref 36.0–46.0)
Hemoglobin: 13.1 g/dL (ref 12.0–15.0)
MCH: 25.1 pg — ABNORMAL LOW (ref 26.0–34.0)
MCHC: 31 g/dL (ref 30.0–36.0)
MCV: 81 fL (ref 80.0–100.0)
Platelets: 218 10*3/uL (ref 150–400)
RBC: 5.21 MIL/uL — ABNORMAL HIGH (ref 3.87–5.11)
RDW: 14.6 % (ref 11.5–15.5)
WBC: 6 10*3/uL (ref 4.0–10.5)
nRBC: 0 % (ref 0.0–0.2)

## 2021-01-26 LAB — POCT PREGNANCY, URINE: Preg Test, Ur: POSITIVE — AB

## 2021-01-26 LAB — ABO/RH: ABO/RH(D): B POS

## 2021-01-26 LAB — HCG, QUANTITATIVE, PREGNANCY: hCG, Beta Chain, Quant, S: 24586 m[IU]/mL — ABNORMAL HIGH (ref <5)

## 2021-01-26 NOTE — MAU Provider Note (Signed)
Chief Complaint:  Vaginal Bleeding and Back Pain   Event Date/Time   First Provider Initiated Contact with Patient 01/26/21 1730     HPI: Misty Curry is a 30 y.o. G2P1001 at [redacted]w[redacted]d who presents to maternity admissions reporting mild cramping and increasing vaginal bleeding. Pregnancy confirmed at [facility] Bleeding began today as brown discharge early this morning which progressed to pink then dark red. As she sat in MAU, bleeding got heavier and more concerning to her. Has a history of SAB at 72mo, is very anxious and upset she may be having another one. Initial visit began in triage as MAU was full, lab work and ultrasound done prior to rooming.   Pregnancy Course: Has not begun prenatal care, waiting for pregnancy Medicaid  Past Medical History:  Diagnosis Date   Asthma    Medical history non-contributory    OB History  Gravida Para Term Preterm AB Living  3 1 1   1 1   SAB IAB Ectopic Multiple Live Births  1       1    # Outcome Date GA Lbr Len/2nd Weight Sex Delivery Anes PTL Lv  3 Current           2 Term 12/31/13 [redacted]w[redacted]d 11:37 / 00:11 7 lb 5 oz (3.317 kg) F Vag-Spont EPI  LIV  1 SAB            Past Surgical History:  Procedure Laterality Date   HERNIA REPAIR     MOLE REMOVAL     Family History  Problem Relation Age of Onset   Alcohol abuse Neg Hx    Asthma Neg Hx    Arthritis Neg Hx    Birth defects Neg Hx    Cancer Neg Hx    COPD Neg Hx    Depression Neg Hx    Diabetes Neg Hx    Drug abuse Neg Hx    Early death Neg Hx    Hearing loss Neg Hx    Heart disease Neg Hx    Hyperlipidemia Neg Hx    Hypertension Neg Hx    Kidney disease Neg Hx    Learning disabilities Neg Hx    Mental illness Neg Hx    Mental retardation Neg Hx    Miscarriages / Stillbirths Neg Hx    Stroke Neg Hx    Vision loss Neg Hx    Varicose Veins Neg Hx    Social History   Tobacco Use   Smoking status: Never Smoker   Smokeless tobacco: Never Used  Substance Use Topics   Alcohol use:  No   Drug use: No   No Known Allergies No medications prior to admission.   I have reviewed patient's Past Medical Hx, Surgical Hx, Family Hx, Social Hx, medications and allergies.   ROS:  Review of Systems  Constitutional: Negative for fatigue and fever.  HENT: Negative for congestion and sore throat.   Gastrointestinal: Positive for abdominal pain. Negative for nausea and vomiting.  Genitourinary: Positive for vaginal bleeding.  Neurological: Negative for dizziness, syncope and headaches.  All other systems reviewed and are negative.  Physical Exam   Patient Vitals for the past 24 hrs:  BP Temp Temp src Pulse Resp SpO2 Height Weight  01/26/21 1825 133/87 98.1 F (36.7 C) Oral (!) 105 17 95 % -- --  01/26/21 1645 131/83 98.7 F (37.1 C) -- (!) 109 18 97 % 5\' 4"  (1.626 m) 178 lb 6.4 oz (80.9 kg)  Constitutional: Well-developed, well-nourished female in acute emotional distress.  Cardiovascular: normal rate & rhythm, no murmur Respiratory: normal effort, lung sounds clear throughout GI: Abd soft, non-tender, gravid appropriate for gestational age. Pos BS x 4 MS: Extremities nontender, no edema, normal ROM Neurologic: Alert and oriented x 4.  GU: no CVA tenderness Pelvic exam deferred, pt sent straight to U/S immediately after rooming  Labs: Results for orders placed or performed during the hospital encounter of 01/26/21 (from the past 24 hour(s))  Pregnancy, urine POC     Status: Abnormal   Collection Time: 01/26/21  4:27 PM  Result Value Ref Range   Preg Test, Ur POSITIVE (A) NEGATIVE  Urinalysis, Routine w reflex microscopic Urine, Clean Catch     Status: Abnormal   Collection Time: 01/26/21  4:33 PM  Result Value Ref Range   Color, Urine AMBER (A) YELLOW   APPearance CLOUDY (A) CLEAR   Specific Gravity, Urine 1.026 1.005 - 1.030   pH 6.0 5.0 - 8.0   Glucose, UA NEGATIVE NEGATIVE mg/dL   Hgb urine dipstick MODERATE (A) NEGATIVE   Bilirubin Urine NEGATIVE NEGATIVE    Ketones, ur 5 (A) NEGATIVE mg/dL   Protein, ur 371 (A) NEGATIVE mg/dL   Nitrite NEGATIVE NEGATIVE   Leukocytes,Ua TRACE (A) NEGATIVE   RBC / HPF 11-20 0 - 5 RBC/hpf   WBC, UA 11-20 0 - 5 WBC/hpf   Bacteria, UA MANY (A) NONE SEEN   Squamous Epithelial / LPF 21-50 0 - 5   Mucus PRESENT   CBC     Status: Abnormal   Collection Time: 01/26/21  5:15 PM  Result Value Ref Range   WBC 6.0 4.0 - 10.5 K/uL   RBC 5.21 (H) 3.87 - 5.11 MIL/uL   Hemoglobin 13.1 12.0 - 15.0 g/dL   HCT 06.2 69.4 - 85.4 %   MCV 81.0 80.0 - 100.0 fL   MCH 25.1 (L) 26.0 - 34.0 pg   MCHC 31.0 30.0 - 36.0 g/dL   RDW 62.7 03.5 - 00.9 %   Platelets 218 150 - 400 K/uL   nRBC 0.0 0.0 - 0.2 %  ABO/Rh     Status: None   Collection Time: 01/26/21  5:15 PM  Result Value Ref Range   ABO/RH(D)      B POS Performed at [facility], [address] [place], [place] [address]   hCG, quantitative, pregnancy     Status: Abnormal   Collection Time: 01/26/21  5:15 PM  Result Value Ref Range   hCG, Beta Chain, Quant, S 24,586 (H) <5 mIU/mL   Imaging:  US OB LESS THAN 14 WEEKS WITH OB TRANSVAGINAL  Result Date: 01/26/2021 CLINICAL DATA:  Heavy bleeding today. Quantitative beta HCG in process. Last menstrual period 12/11/2020, gestational age by last menstrual period 6 weeks and 4 days. Estimated due date by last menstrual period 09/17/2021. G2P1. EXAM: OBSTETRIC <14 WK [place] AND TRANSVAGINAL OB US TECHNIQUE: Both transabdominal and transvaginal ultrasound examinations were performed for complete evaluation of the gestation as well as the maternal uterus, adnexal regions, and pelvic cul-de-sac. Transvaginal technique was performed to assess early pregnancy. COMPARISON:  None. FINDINGS: Intrauterine gestational sac: Single Yolk sac:  Visualized. Embryo:  Visualized. Cardiac Activity: Not Visualized. CRL:  6.3 mm   6 w   3 d                  [place] EDC: 07/19/2021 Subchorionic hemorrhage:  None visualized. Maternal uterus/adnexae: The  uterus is otherwise unremarkable. Bilateral ovaries are unremarkable. Other: No free pelvic fluid. IMPRESSION: Single intrauterine pregnancy with no cardiac activity visualized. Gestational age by ultrasound of 6 weeks and 3 days that is concordant with gestational age by last menstrual of 6 weeks and 4 days. Recommend repeat ultrasound in 7 days to evaluate for viability. Electronically Signed   By: Tish Frederickson M.D.   On: 01/26/2021 18:10    MAU Course: Orders Placed This Encounter  Procedures   US OB LESS THAN 14 WEEKS WITH OB TRANSVAGINAL   US OB Transvaginal   Urinalysis, Routine w reflex microscopic Urine, Clean Catch   CBC   hCG, quantitative, pregnancy   Pregnancy, urine POC   ABO/Rh   Discharge patient   MDM: Reviewed results with pt and gave reassurance that pregnancy looks to be developing on track. Possible she had a [facility] that was present and is responsible for bleeding. Will send for repeat U/S in one week at [facility].  UA suggests dehydration, pt asymptomatic, will encourage fluids  Assessment: 1. Intrauterine pregnancy   2. Vaginal bleeding in pregnancy, first trimester    Plan: Discharge home in stable condition with bleeding precautions.     Follow-up Information     [facility]. Call.   Specialty: Obstetrics and Gynecology Why: Office will call you on Monday to schedule repeat ultrasound next Friday. Contact information: [address] [place] [address] [phone]        [facility] [facility] .   Specialty: Radiology Contact information: [address] [place] [address] [phone]                Allergies as of 01/26/2021   No Known Allergies      Medication List     TAKE these medications    albuterol 108 (90 Base) MCG/ACT inhaler Commonly known as: VENTOLIN HFA Inhale 1-2 puffs into the lungs every 6 (six) hours as needed  for wheezing or shortness of breath.   cetirizine 10 MG tablet Commonly known as: ZYRTEC Take 1 tablet (10 mg total) by mouth daily.        Edd Arbour, CNM, MSN, IBCLC Certified Nurse Midwife, [facility]

## 2021-01-26 NOTE — MAU Note (Signed)
Urine sent to lab, not enough for culture tube

## 2021-01-26 NOTE — Discharge Instructions (Signed)
Vaginal Bleeding During Pregnancy, First Trimester A small amount of bleeding from the vagina is common during early pregnancy. This kind of bleeding is also called spotting. Sometimes the bleeding is normal and does not cause problems. At other times, though, bleeding may be a sign of something serious. Normal bleeding in pregnancy can happen:  When the fertilized egg attaches itself to your womb.  When blood vessels change because of the pregnancy.  When you have pelvic exams.  When you have sex. Abnormal bleeding can happen:  When you have an infection.  When you have growths in your womb. The growths are called polyps.  If you are having a miscarriage or at risk of having one.  If you have other problems in your pregnancy. Tell your doctor right away about any bleeding from your vagina. Follow these instructions at home: Watch your bleeding  Watch your condition for any changes. Let your doctor know if you are worried about something.  Try to know what causes your bleeding. Ask yourself these questions: ? Does the bleeding start on its own? ? Does the bleeding start after something is done, such as sex or a pelvic exam?  Use a diary to write the things you see about your bleeding. Write in your diary: ? If the bleeding flows freely without stopping, or if it starts and stops, and then starts again. ? If the bleeding is heavy or light. ? How many pads you use in a day and how much blood is in them.  Tell your doctor if you pass tissue. He or she may want to see it.   Activity  Follow your doctor's instructions about how active you can be. Ask what activities are safe for you.  Do not have sex or orgasms until your doctor says that this is safe.  If needed, make plans for someone to help with your normal activities. General instructions  Take over-the-counter and prescription medicines only as told by your doctor.  Do not take aspirin because it can cause  bleeding.  Do not use tampons.  Do not douche.  Keep all follow-up visits. Contact a doctor if:  You have vaginal bleeding at any time while you are pregnant.  You have cramps.  You have a fever or chills. Get help right away if:  You have very bad cramps in your back or belly (abdomen).  You pass large clots or a lot of tissue from your vagina.  Your bleeding gets worse.  You feel light-headed.  You feel weak.  You pass out (faint).  You have chills.  You are leaking fluid from your vagina.  You have a gush of fluid from your vagina. Summary  Sometimes vaginal bleeding during pregnancy is normal and does not cause problems. At other times, bleeding may be a sign of something serious.  Tell your doctor right away about any bleeding from your vagina.  Follow your doctor's instructions about how active you can be. You may need someone to help you with your normal activities.  Keep all follow-up visits. This information is not intended to replace advice given to you by your health care provider. Make sure you discuss any questions you have with your health care provider. Document Revised: 06/22/2020 Document Reviewed: 06/22/2020 Elsevier Patient Education  2021 Elsevier Inc.  

## 2021-01-26 NOTE — MAU Note (Addendum)
Saw brown when used the restroom the morning, as the day went on, it turned pink and now reddish.  Having back pain, started today, constant ache- muscle type of pain.  (+HPT, had been confirmed at [facility])

## 2021-01-28 ENCOUNTER — Inpatient Hospital Stay (HOSPITAL_COMMUNITY)
Admission: AD | Admit: 2021-01-28 | Discharge: 2021-01-28 | Disposition: A | Discharge status: Home / Self Care | Payer: Medicaid Other | Attending: Obstetrics & Gynecology | Admitting: Obstetrics & Gynecology

## 2021-01-28 ENCOUNTER — Encounter (HOSPITAL_COMMUNITY): Payer: Self-pay | Admitting: Obstetrics & Gynecology

## 2021-01-28 ENCOUNTER — Other Ambulatory Visit: Payer: Self-pay

## 2021-01-28 DIAGNOSIS — Z79899 Other long term (current) drug therapy: Secondary | ICD-10-CM | POA: Insufficient documentation

## 2021-01-28 DIAGNOSIS — O99511 Diseases of the respiratory system complicating pregnancy, first trimester: Secondary | ICD-10-CM | POA: Insufficient documentation

## 2021-01-28 DIAGNOSIS — Z3A01 Less than 8 weeks gestation of pregnancy: Secondary | ICD-10-CM | POA: Diagnosis not present

## 2021-01-28 DIAGNOSIS — J45909 Unspecified asthma, uncomplicated: Secondary | ICD-10-CM | POA: Diagnosis not present

## 2021-01-28 DIAGNOSIS — O039 Complete or unspecified spontaneous abortion without complication: Secondary | ICD-10-CM | POA: Diagnosis not present

## 2021-01-28 LAB — CBC
HCT: 38.2 % (ref 36.0–46.0)
Hemoglobin: 11.7 g/dL — ABNORMAL LOW (ref 12.0–15.0)
MCH: 24.6 pg — ABNORMAL LOW (ref 26.0–34.0)
MCHC: 30.6 g/dL (ref 30.0–36.0)
MCV: 80.3 fL (ref 80.0–100.0)
Platelets: 214 10*3/uL (ref 150–400)
RBC: 4.76 MIL/uL (ref 3.87–5.11)
RDW: 14.5 % (ref 11.5–15.5)
WBC: 7.8 10*3/uL (ref 4.0–10.5)
nRBC: 0 % (ref 0.0–0.2)

## 2021-01-28 LAB — URINALYSIS, MICROSCOPIC (REFLEX): RBC / HPF: >50 RBC/hpf (ref 0–5)

## 2021-01-28 LAB — URINALYSIS, ROUTINE W REFLEX MICROSCOPIC

## 2021-01-28 LAB — HCG, QUANTITATIVE, PREGNANCY: hCG, Beta Chain, Quant, S: 9502 m[IU]/mL — ABNORMAL HIGH (ref <5)

## 2021-01-28 MED ORDER — IBUPROFEN 800 MG PO TABS: 800.0000 mg | ORAL_TABLET | Freq: Three times a day (TID) | ORAL | 0 refills | Status: DC | PRN

## 2021-01-28 MED ORDER — OXYCODONE-ACETAMINOPHEN 5-325 MG PO TABS: 1.0000 | ORAL_TABLET | Freq: Four times a day (QID) | ORAL | 0 refills | Status: DC | PRN

## 2021-01-28 MED ORDER — OXYCODONE-ACETAMINOPHEN 5-325 MG PO TABS
1.0000 | ORAL_TABLET | Freq: Once | ORAL | Status: AC
  Administered 2021-01-28: 1 via ORAL
  Filled 2021-01-28: qty 1

## 2021-01-28 MED ORDER — ONDANSETRON 4 MG PO TBDP: 4.0000 mg | ORAL_TABLET | Freq: Three times a day (TID) | ORAL | 0 refills | Status: DC | PRN

## 2021-01-28 NOTE — Discharge Instructions (Signed)
Miscarriage A miscarriage is the loss of pregnancy before the 20th week. Most miscarriages happen during the first 3 months of pregnancy. Sometimes, a miscarriage can happen before a woman knows that she is pregnant. Having a miscarriage can be an emotional experience. If you have had a miscarriage, talk with your health care provider about any questions you may have about the loss of your baby, the grieving process, and your plans for future pregnancy. What are the causes? Many times, the cause of a miscarriage is not known. What increases the risk? The following factors may make a pregnant woman more likely to have a miscarriage: Certain medical conditions  Conditions that affect the hormone balance in the body, such as thyroid disease or polycystic ovary syndrome.  Diabetes.  Autoimmune disorders.  Infections.  Bleeding disorders.  Obesity. Lifestyle factors  Using products with tobacco or nicotine in them or being exposed to tobacco smoke.  Having alcohol.  Having large amounts of caffeine.  Recreational drug use. Problems with reproductive organs or structures  Cervical insufficiency. This is when the lowest part of the uterus (cervix) opens and thins before pregnancy is at term.  Having a condition called Asherman syndrome. This syndrome causes scarring in the uterus or causes the uterus to be abnormal in structure.  Fibrous growths, called fibroids, in the uterus.  Congenital abnormalities. These problems are present at birth.  Infection of the cervix or uterus. Personal or medical history  Injury (trauma).  Having had a miscarriage before.  Being younger than age 18 or older than age 35.  Exposure to harmful substances in the environment. This may include radiation or heavy metals, such as lead.  Use of certain medicines. What are the signs or symptoms? Symptoms of this condition include:  Vaginal bleeding or spotting, with or without cramps or  pain.  Pain or cramping in the abdomen or lower back.  Fluid or tissue coming out of the vagina. How is this diagnosed? This condition may be diagnosed based on:  A physical exam.  Ultrasound.  Lab tests, such as blood tests, urine tests, or swabs for infection. How is this treated? Treatment for a miscarriage is sometimes not needed if all the pregnancy tissue that was in the uterus comes out on its own, and there are no other problems such as infection or heavy bleeding. In other cases, this condition may be treated with:  Dilation and curettage (D&C). In this procedure, the cervix is stretched open and any remaining pregnancy tissue is removed from the lining of the uterus (endometrium).  Medicines. These may include: ? Antibiotic medicine, to treat infection. ? Medicine to help any remaining pregnancy tissue come out of the body. ? Medicine to reduce (contract) the size of the uterus. These medicines may be given if there is a lot of bleeding. If you have Rh-negative blood, you may be given an injection of a medicine called Rho(D) immune globulin. This medicine helps prevent problems with future pregnancies. Follow these instructions at home: Medicines  Take over-the-counter and prescription medicines only as told by your health care provider.  If you were prescribed antibiotic medicine, take it as told by your health care provider. Do not stop taking the antibiotic even if you start to feel better. Activity  Rest as told by your health care provider. Ask your health care provider what activities are safe for you.  Have someone help with home and family responsibilities during this time. General instructions  Monitor how much tissue   or blood clot material comes out of the vagina.  Do not have sex, douche, or put anything, such as tampons, in your vagina until your health care provider says it is okay.  To help you and your partner with the grieving process, talk with your  health care provider or get counseling.  When you are ready, meet with your health care provider to discuss any important steps you should take for your health. Also, discuss steps you should take to have a healthy pregnancy in the future.  Keep all follow-up visits. This is important.   Where to find more information  The American College of Obstetricians and Gynecologists: acog.org  [place]. Department of Health and Human Services Office of Women's Health: hrsa.gov/office-womens-health Contact a health care provider if:  You have a fever or chills.  There is bad-smelling fluid coming from the vagina.  You have more bleeding instead of less.  Tissue or blood clots come out of your vagina. Get help right away if:  You have severe cramps or pain in your back or abdomen.  Heavy bleeding soaks through 2 large sanitary pads an hour for more than 2 hours.  You become light-headed or weak.  You faint.  You feel sad, and your sadness takes over your thoughts.  You think about hurting yourself. If you ever feel like you may hurt yourself or others, or have thoughts about taking your own life, get help right away. Go to your nearest emergency department or:  Call your local emergency services (911 in the [place]  Call a suicide crisis helpline, such as the National Suicide Prevention Lifeline at 1-800-273-8255. This is open 24 hours a day in the [place]  Text the Crisis Text Line at 741741 (in the [place] Summary  Most miscarriages happen in the first 3 months of pregnancy. Sometimes miscarriage happens before a woman knows that she is pregnant.  Follow instructions from your health care provider about medicines and activity.  To help you and your partner with grieving, talk with your health care provider or get counseling.  Keep all follow-up visits. This information is not intended to replace advice given to you by your health care provider. Make sure you discuss any questions you  have with your health care provider. Document Revised: 03/31/2020 Document Reviewed: 03/31/2020 Elsevier Patient Education  2021 Elsevier Inc.  

## 2021-01-28 NOTE — MAU Provider Note (Signed)
History     CSN: [id number]  Arrival date and time: 01/28/21 0855   Event Date/Time   First Provider Initiated Contact with Patient 01/28/21 [phone]      Chief Complaint  Patient presents with  . Vaginal Bleeding  . Abdominal Pain   Misty Curry is a 30 y.o. G3P1011 at [redacted]w[redacted]d who presents today with increasing vaginal bleeding and cramping. She reports that after leaving here on 01/25/2021 she continued to have bleeding. However, last night (01/27/2021) the bleeding became much heavier, she was passing about golf ball sized clots, and she was having worsening pain.   Vaginal Bleeding The patient's primary symptoms include pelvic pain and vaginal bleeding. This is a new problem. The current episode started yesterday. The problem occurs constantly. The problem has been gradually worsening. Pertinent negatives include no chills, dysuria, fever, nausea or vomiting. The vaginal discharge was bloody. The vaginal bleeding is heavier than menses. She has been passing clots (about the size of a golf ball ). She has not been passing tissue. Nothing aggravates the symptoms. She has tried nothing for the symptoms.    OB History    Gravida  3   Para  1   Term  1   Preterm      AB  1   Living  1     SAB  1   IAB      Ectopic      Multiple      Live Births  1           Past Medical History:  Diagnosis Date  . Asthma   . Medical history non-contributory     Past Surgical History:  Procedure Laterality Date  . HERNIA REPAIR    . MOLE REMOVAL      Family History  Problem Relation Age of Onset  . Alcohol abuse Neg Hx   . Asthma Neg Hx   . Arthritis Neg Hx   . Birth defects Neg Hx   . Cancer Neg Hx   . COPD Neg Hx   . Depression Neg Hx   . Diabetes Neg Hx   . Drug abuse Neg Hx   . Early death Neg Hx   . Hearing loss Neg Hx   . Heart disease Neg Hx   . Hyperlipidemia Neg Hx   . Hypertension Neg Hx   . Kidney disease Neg Hx   . Learning disabilities Neg Hx    . Mental illness Neg Hx   . Mental retardation Neg Hx   . Miscarriages / Stillbirths Neg Hx   . Stroke Neg Hx   . Vision loss Neg Hx   . Varicose Veins Neg Hx     Social History   Tobacco Use  . Smoking status: Never Smoker  . Smokeless tobacco: Never Used  Substance Use Topics  . Alcohol use: No  . Drug use: No    Allergies: No Known Allergies  Medications Prior to Admission  Medication Sig Dispense Refill Last Dose  . albuterol (VENTOLIN HFA) 108 (90 Base) MCG/ACT inhaler Inhale 1-2 puffs into the lungs every 6 (six) hours as needed for wheezing or shortness of breath. 18 g 0   . cetirizine (ZYRTEC) 10 MG tablet Take 1 tablet (10 mg total) by mouth daily. 30 tablet 0     Review of Systems  Constitutional: Negative for chills and fever.  Gastrointestinal: Negative for nausea and vomiting.  Genitourinary: Positive for pelvic pain and vaginal bleeding. Negative  for dysuria.   Physical Exam   Blood pressure 128/90, pulse (!) 109, temperature 98.2 F (36.8 C), temperature source Oral, resp. rate 17, last menstrual period 12/11/2020, SpO2 98 %.  Physical Exam Vitals and nursing note reviewed. Exam conducted with a chaperone present.  Constitutional:      General: She is not in acute distress. HENT:     Head: Normocephalic.  Eyes:     Pupils: Pupils are equal, round, and reactive to light.  Pulmonary:     Effort: Pulmonary effort is normal.  Abdominal:     Palpations: Abdomen is soft.     Tenderness: There is no abdominal tenderness.  Genitourinary:    General: Normal vulva.  Skin:    General: Skin is warm and dry.  Neurological:     Mental Status: She is alert and oriented to person, place, and time.  Psychiatric:        Mood and Affect: Mood normal.        Behavior: Behavior normal.     Comments: Tearful     Results for MAKINZY, CLEERE (MRN [id number]) as of 01/28/2021 11:09  Ref. Range 01/26/2021 17:15 01/26/2021 18:05 01/28/2021 09:13 01/28/2021 09:46   HCG, Beta Chain, Quant, S Latest Ref Range: <5 mIU/mL 24,586 (H)   9,502 (H)    Results for orders placed or performed during the hospital encounter of 01/28/21 (from the past 24 hour(s))  CBC     Status: Abnormal   Collection Time: 01/28/21  9:46 AM  Result Value Ref Range   WBC 7.8 4.0 - 10.5 K/uL   RBC 4.76 3.87 - 5.11 MIL/uL   Hemoglobin 11.7 (L) 12.0 - 15.0 g/dL   HCT 17.4 08.1 - 44.8 %   MCV 80.3 80.0 - 100.0 fL   MCH 24.6 (L) 26.0 - 34.0 pg   MCHC 30.6 30.0 - 36.0 g/dL   RDW 18.5 63.1 - 49.7 %   Platelets 214 150 - 400 K/uL   nRBC 0.0 0.0 - 0.2 %  hCG, quantitative, pregnancy     Status: Abnormal   Collection Time: 01/28/21  9:46 AM  Result Value Ref Range   hCG, Beta Chain, Quant, S 9,502 (H) <5 mIU/mL    MAU Course  Procedures  MDM DW patient about hcg dropping more than 50% in 48 hours which is indicative of a SAB in progress. Patient is appropriately tearful. Questions answered, anticipatory guidance with regards to bleeding and SAB process provided. Will have patient FU in 1 week for repeat hcg and 2 weeks for provider visit.   Assessment and Plan   1. SAB (spontaneous abortion)   2. [redacted] weeks gestation of pregnancy    DC home in stable condition FU at [facility] in one week for repeat HCG and two weeks for SAB FU visit  1stTrimester precautions  Bleeding precautions RX: percocet PRN #15, zofran PRN #20, Ibuprofen PRN #30  Return to MAU as needed FU with OB as planned   Follow-up Information    [facility] for [facility] Follow up.   Specialty: Obstetrics and Gynecology Contact information: [address] [place] [address] [phone]             Thressa Sheller DNP, CNM  01/28/21  11:26 AM

## 2021-01-28 NOTE — MAU Note (Signed)
Pt reports to mau with c/o lower abd cramping and heavy vag bleeding since last night at 9pm.  Pt states she has been passing golf ball sized clots.

## 2021-02-05 ENCOUNTER — Other Ambulatory Visit: Payer: Medicaid Other

## 2021-02-14 ENCOUNTER — Ambulatory Visit: Payer: Medicaid Other | Admitting: Obstetrics and Gynecology

## 2021-02-19 ENCOUNTER — Ambulatory Visit: Admission: RE | Admit: 2021-02-19 | Payer: Medicaid Other | Source: Ambulatory Visit

## 2021-09-02 IMAGING — US US OB < 14 WEEKS - US OB TV
1 series · 15 of 28 positions shown · non-contrast
Comparison: None.

CLINICAL DATA: Heavy bleeding today. Quantitative beta HCG in
process. Last menstrual period 12/11/2020, gestational age by last
menstrual period 6 weeks and 4 days. Estimated due date by last
menstrual period 09/17/2021. G2P1.

EXAM:
OBSTETRIC <14 WK US AND TRANSVAGINAL OB US
TECHNIQUE: Both transabdominal and transvaginal ultrasound examinations were
performed for complete evaluation of the gestation as well as the
maternal uterus, adnexal regions, and pelvic cul-de-sac.
Transvaginal technique was performed to assess early pregnancy.

[Series 1: us ob < 14 weeks - us ob tv · 15 of 72 slices shown]
[im 1/72]
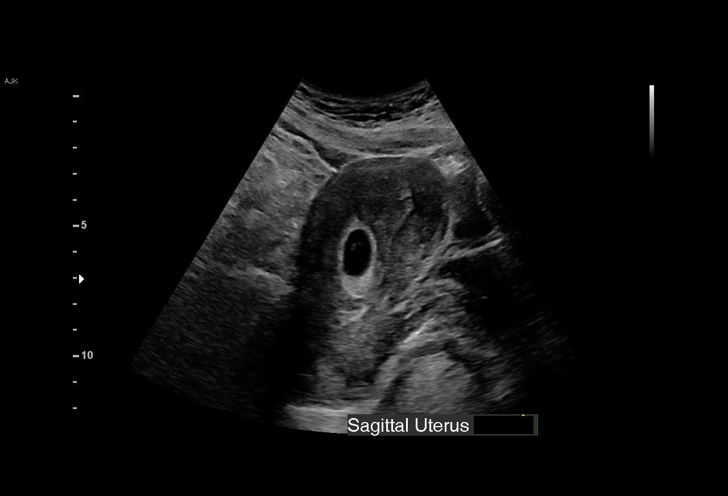
[im 6/72]
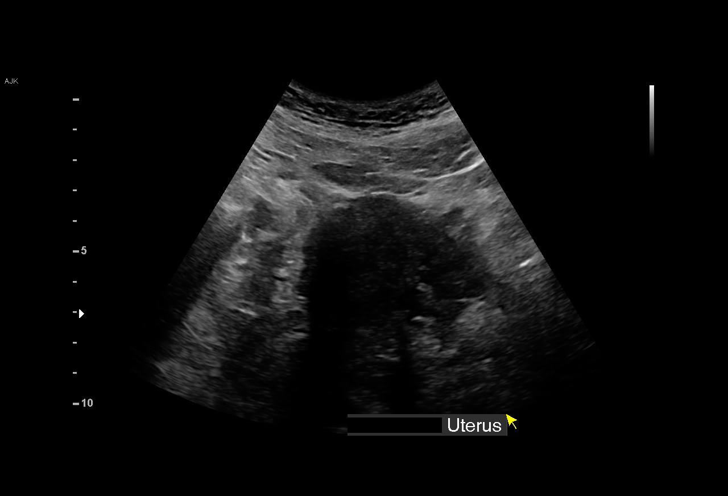
[im 11/72]
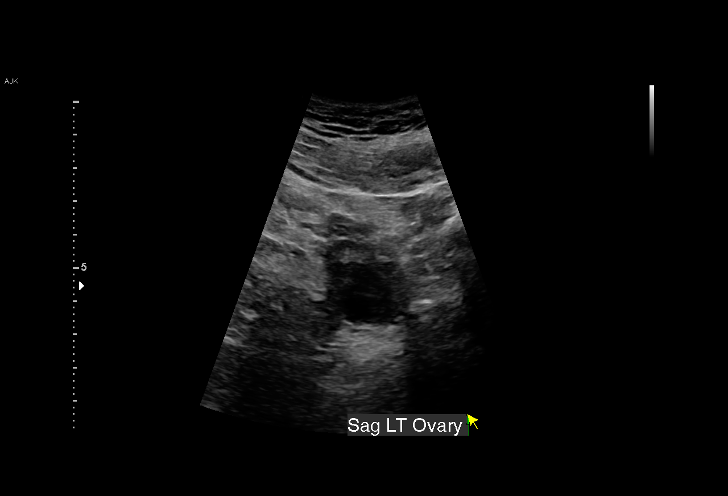
[im 16/72]
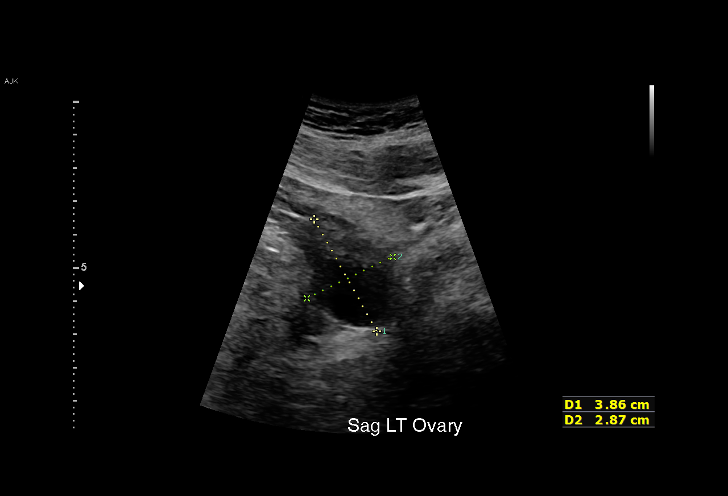
[im 22/72]
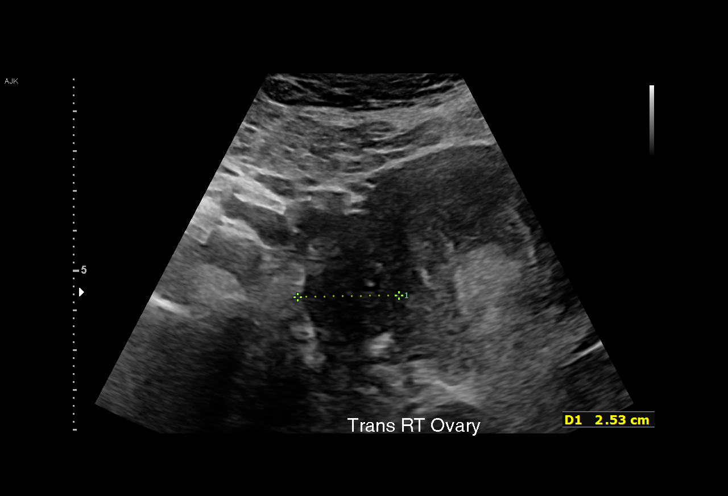
[im 27/72]
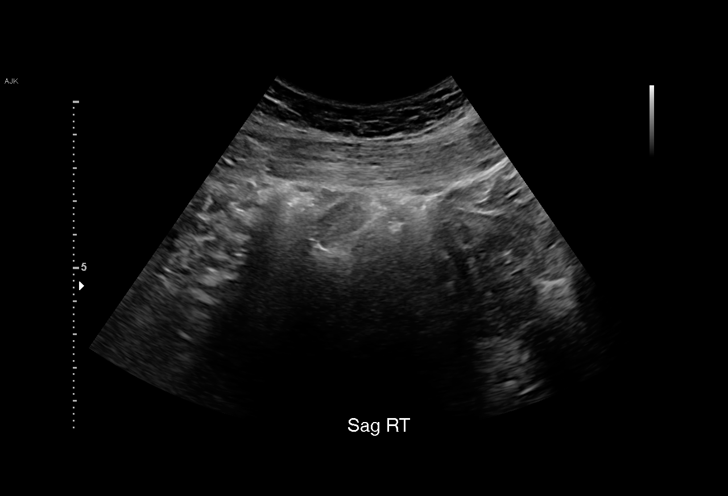
[im 32/72]
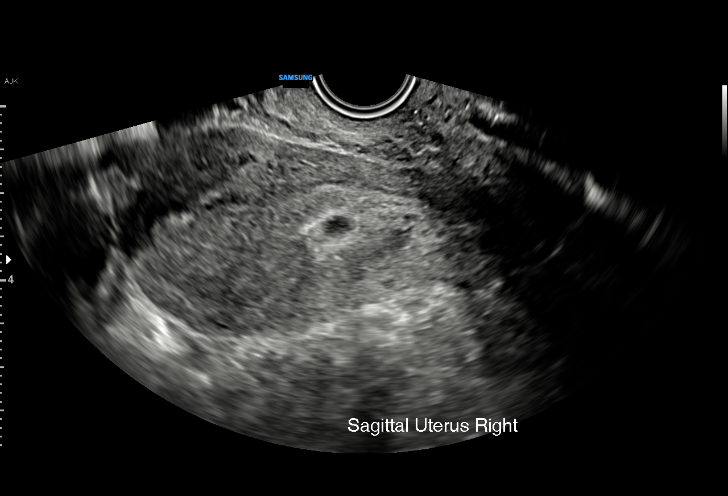
[im 37/72]
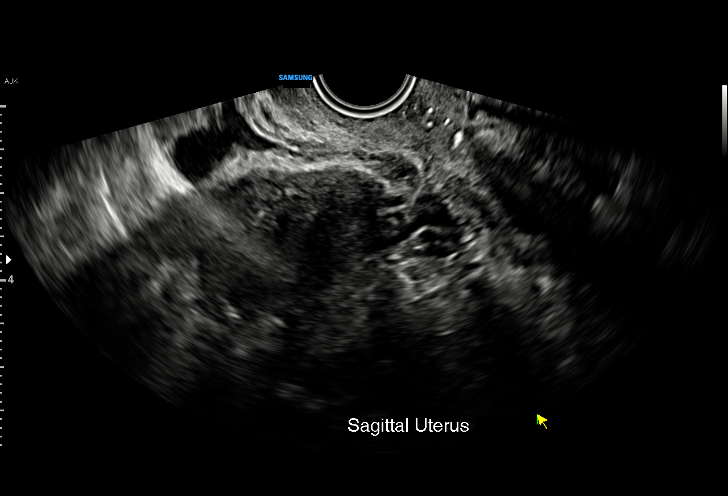
[im 40/72]
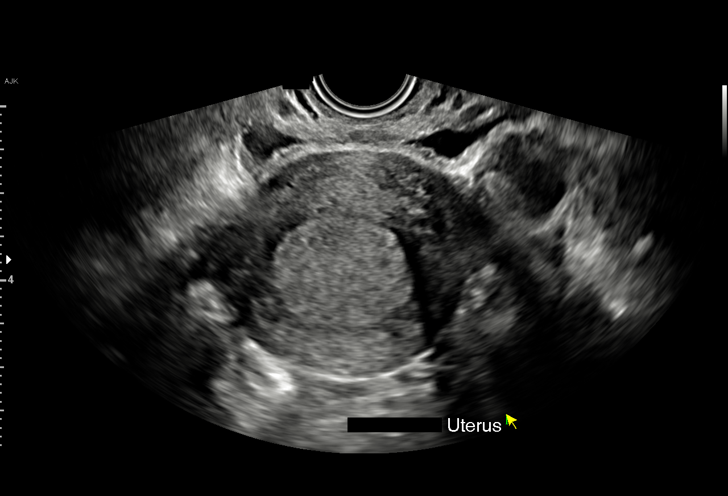
[im 45/72]
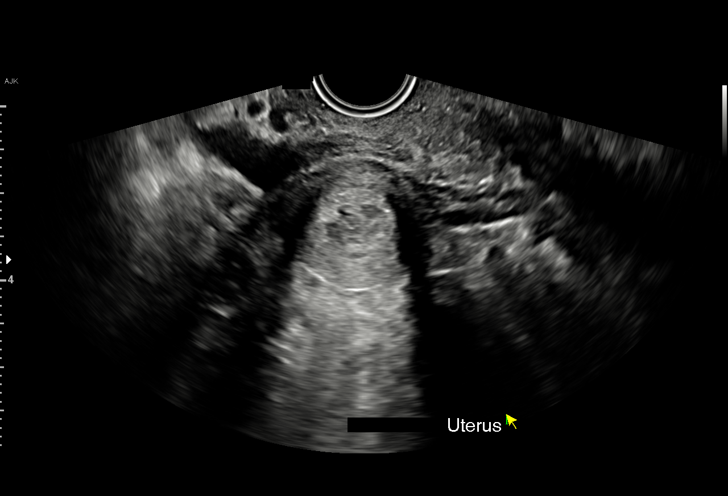
[im 50/72]
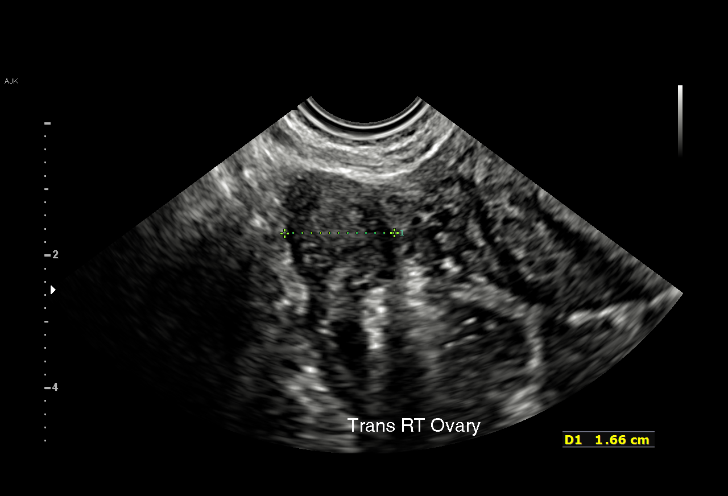
[im 56/72]
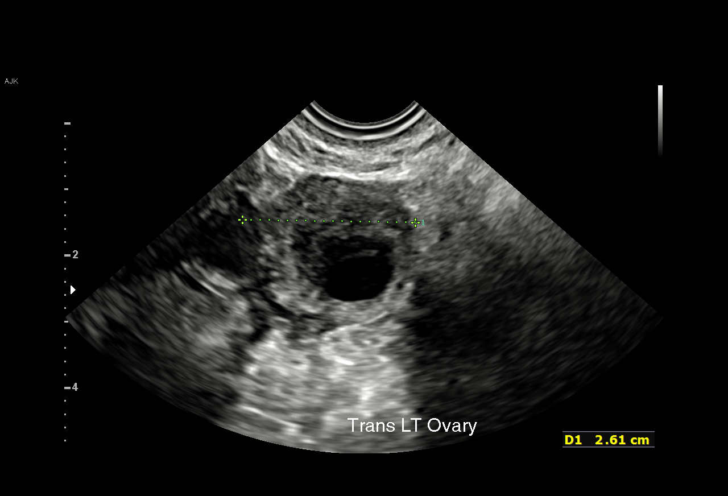
[im 61/72]
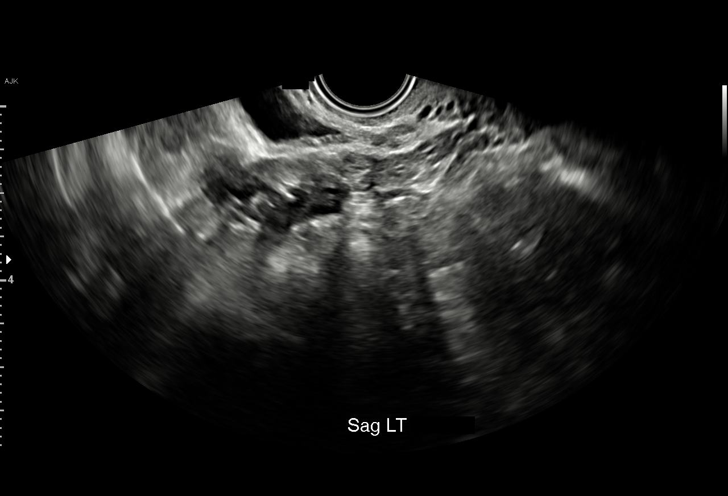
[im 66/72]
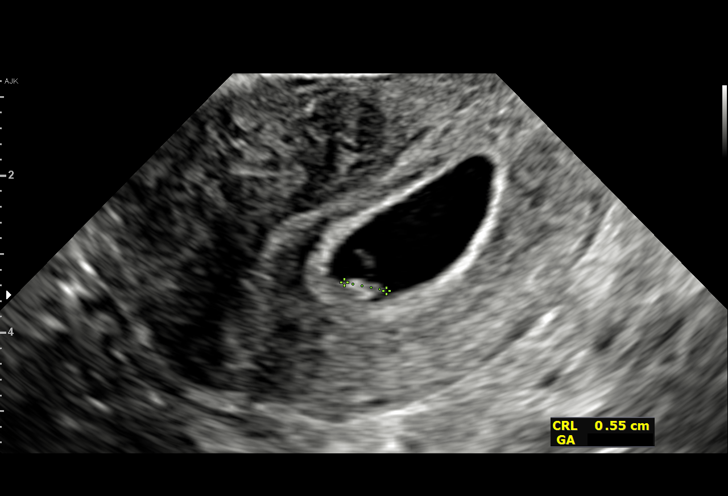
[im 72/72]
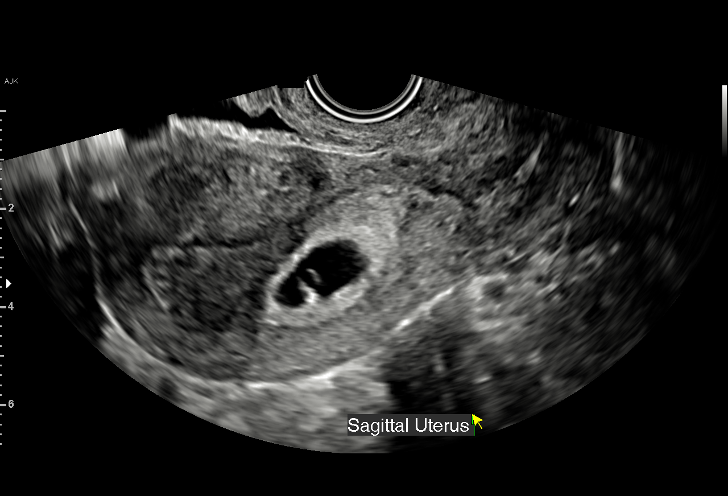

[15 of 28 positions shown; findings below may reference images not displayed]

FINDINGS: Intrauterine gestational sac: Single

Yolk sac:  Visualized.

Embryo:  Visualized.

Cardiac Activity: Not Visualized.

CRL:  6.3 mm   6 w   3 d                  US EDC: 07/19/2021

Subchorionic hemorrhage:  None visualized.

Maternal uterus/adnexae: The uterus is otherwise unremarkable.
Bilateral ovaries are unremarkable.

Other: No free pelvic fluid.
IMPRESSION: Single intrauterine pregnancy with no cardiac activity visualized.
Gestational age by ultrasound of 6 weeks and 3 days that is
concordant with gestational age by last menstrual of 6 weeks and 4
days. Recommend repeat ultrasound in 7 days to evaluate for
viability.

## 2021-10-14 NOTE — L&D Delivery Note (Signed)
Patient: Misty Curry MRN: [id number]  GBS status: Neg  Patient is a 31 y.o. now [id number] s/p NSVD at [redacted]w[redacted]d, who was admitted for SOL. AROM 0h 80m prior to delivery with clear fluid.    Delivery Note At 2:45 AM a viable female was delivered via Vaginal, Spontaneous (Presentation: Right Occiput Anterior).  APGAR: 9, 9; weight  .   Placenta status: Spontaneous, Intact.  Cord: 3 vessels with the following complications: None.  Head delivered ROA. Nuchal cord present and reduced after delivery. Shoulder and body delivered in usual fashion. Infant with spontaneous cry, placed on mother's abdomen, dried and bulb suctioned. Cord clamped x 2 after 1-minute delay, and cut by family member. Cord blood drawn. Placenta delivered spontaneously with gentle cord traction. Fundus firm with massage and Pitocin. Perineum inspected and found to have right labial laceration, which was repaired with 3-0 vicryl rapid suture  with good hemostasis achieved.  Anesthesia: Epidural Episiotomy: None Lacerations: Labial Suture Repair: 3.0 vicryl rapide Est. Blood Loss (mL): 66  Mom to postpartum.  Baby to Couplet care / Skin to Skin.  Celedonio Savage 06/02/2022, 3:22 AM

## 2021-11-13 DIAGNOSIS — Z3A01 Less than 8 weeks gestation of pregnancy: Secondary | ICD-10-CM | POA: Diagnosis not present

## 2021-11-13 DIAGNOSIS — R110 Nausea: Secondary | ICD-10-CM | POA: Diagnosis not present

## 2021-12-07 ENCOUNTER — Other Ambulatory Visit: Payer: Self-pay

## 2021-12-07 DIAGNOSIS — Z3480 Encounter for supervision of other normal pregnancy, unspecified trimester: Secondary | ICD-10-CM

## 2021-12-07 DIAGNOSIS — O0990 Supervision of high risk pregnancy, unspecified, unspecified trimester: Secondary | ICD-10-CM | POA: Insufficient documentation

## 2021-12-07 MED ORDER — BLOOD PRESSURE KIT DEVI: 1.0000 | 0 refills | Status: AC

## 2021-12-10 ENCOUNTER — Ambulatory Visit (INDEPENDENT_AMBULATORY_CARE_PROVIDER_SITE_OTHER): Payer: Medicaid Other | Admitting: Obstetrics and Gynecology

## 2021-12-10 ENCOUNTER — Encounter: Payer: Self-pay | Admitting: Obstetrics and Gynecology

## 2021-12-10 ENCOUNTER — Other Ambulatory Visit: Payer: Self-pay

## 2021-12-10 ENCOUNTER — Ambulatory Visit (INDEPENDENT_AMBULATORY_CARE_PROVIDER_SITE_OTHER): Payer: Medicaid Other

## 2021-12-10 ENCOUNTER — Other Ambulatory Visit (HOSPITAL_COMMUNITY)
Admission: RE | Admit: 2021-12-10 | Discharge: 2021-12-10 | Disposition: A | Discharge status: Home / Self Care | Payer: Medicaid Other | Source: Ambulatory Visit | Attending: Obstetrics and Gynecology | Admitting: Obstetrics and Gynecology

## 2021-12-10 ENCOUNTER — Ambulatory Visit: Payer: Self-pay

## 2021-12-10 VITALS — BP 126/74 | HR 92 | Temp 98.6°F | Wt 183.0 lb

## 2021-12-10 DIAGNOSIS — Z3480 Encounter for supervision of other normal pregnancy, unspecified trimester: Secondary | ICD-10-CM

## 2021-12-10 DIAGNOSIS — Z31430 Encounter of female for testing for genetic disease carrier status for procreative management: Secondary | ICD-10-CM | POA: Diagnosis not present

## 2021-12-10 DIAGNOSIS — O99210 Obesity complicating pregnancy, unspecified trimester: Secondary | ICD-10-CM | POA: Insufficient documentation

## 2021-12-10 DIAGNOSIS — O3680X0 Pregnancy with inconclusive fetal viability, not applicable or unspecified: Secondary | ICD-10-CM

## 2021-12-10 DIAGNOSIS — Z3A13 13 weeks gestation of pregnancy: Secondary | ICD-10-CM

## 2021-12-10 DIAGNOSIS — Z3482 Encounter for supervision of other normal pregnancy, second trimester: Secondary | ICD-10-CM | POA: Diagnosis not present

## 2021-12-10 DIAGNOSIS — Z6831 Body mass index (BMI) 31.0-31.9, adult: Secondary | ICD-10-CM

## 2021-12-10 DIAGNOSIS — Z3481 Encounter for supervision of other normal pregnancy, first trimester: Secondary | ICD-10-CM | POA: Diagnosis not present

## 2021-12-10 DIAGNOSIS — O99211 Obesity complicating pregnancy, first trimester: Secondary | ICD-10-CM

## 2021-12-10 NOTE — Progress Notes (Signed)
New OB Note  12/10/2021   Clinic: [facility]  Chief Complaint: new OB  Transfer of Care Patient: no  History of Present Illness: Misty Curry is a 31 y.o. G4P1011 @ 13/4 weeks (EDC 8/31, based on Patient's last menstrual period was 08/28/2021.=13wk u/s today).  Preg complicated by has Supervision of other normal pregnancy, antepartum; BMI 31.0-31.9,adult; and Obesity in pregnancy on their problem list.   Any events prior to today's visit: no Her periods were: qmonth, regular She has Negative signs or symptoms of miscarriage or preterm labor  ROS: A 12-point review of systems was performed and negative, except as stated in the above HPI.  OBGYN History: As per HPI. OB History  Gravida Para Term Preterm AB Living  4 1 1   1 1   SAB IAB Ectopic Multiple Live Births  1       1    # Outcome Date GA Lbr Len/2nd Weight Sex Delivery Anes PTL Lv  4 Current           3 Term 12/31/13 [redacted]w[redacted]d 11:37 / 00:11 7 lb 5 oz (3.317 kg) F Vag-Spont EPI  LIV  2 SAB           1 Gravida             Any issues with any prior pregnancies: no   Past Medical History: Past Medical History:  Diagnosis Date   Anxiety    Asthma    Vaginal Pap smear, abnormal     Past Surgical History: Past Surgical History:  Procedure Laterality Date   HERNIA REPAIR     MOLE REMOVAL      Family History:  Family History  Problem Relation Age of Onset   Alcohol abuse Neg Hx    Asthma Neg Hx    Arthritis Neg Hx    Birth defects Neg Hx    Cancer Neg Hx    COPD Neg Hx    Depression Neg Hx    Diabetes Neg Hx    Drug abuse Neg Hx    Early death Neg Hx    Hearing loss Neg Hx    Heart disease Neg Hx    Hyperlipidemia Neg Hx    Hypertension Neg Hx    Kidney disease Neg Hx    Learning disabilities Neg Hx    Mental illness Neg Hx    Mental retardation Neg Hx    Miscarriages / Stillbirths Neg Hx    Stroke Neg Hx    Vision loss Neg Hx    Varicose Veins Neg Hx    Social History:  Social History   Socioeconomic  History   Marital status: Single    Spouse name: Not on file   Number of children: 1   Years of education: Not on file   Highest education level: Not on file  Occupational History   Not on file  Tobacco Use   Smoking status: Never   Smokeless tobacco: Never  Vaping Use   Vaping Use: Never used  Substance and Sexual Activity   Alcohol use: No   Drug use: No   Sexual activity: Yes  Other Topics Concern   Not on file  Social History Narrative   Not on file   Social Determinants of Health   Financial Resource Strain: Not on file  Food Insecurity: Not on file  Transportation Needs: Not on file  Physical Activity: Not on file  Stress: Not on file  Social Connections: Not on file  Intimate Partner Violence: Not on file    Allergy: No Known Allergies   Current Outpatient Medications: Prenatal vitamins  Physical Exam:   BP 126/74    Pulse 92    Temp 98.6 F (37 C)    Wt 183 lb (83 kg)    LMP 08/28/2021    BMI 31.41 kg/m  Body mass index is 31.41 kg/m. Contractions: Not present Vag. Bleeding: None. FHTs: 140s  General appearance: Well nourished, well developed female in no acute distress.  Neck:  Supple, normal appearance, and no thyromegaly  Cardiovascular: S1, S2 normal, no murmur, rub or gallop, regular rate and rhythm Respiratory:  Clear to auscultation bilateral. Normal respiratory effort Abdomen: positive bowel sounds and no masses, hernias; diffusely non tender to palpation, non distended Breasts: patient denies any abnormal breast s/s Neuro/Psych:  Normal mood and affect.  Skin:  Warm and dry.  Lymphatic:  No inguinal lymphadenopathy.   Pelvic exam: is not limited by body habitus EGBUS: within normal limits, Vagina: within normal limits and with no blood in the vault, Cervix: normal appearing cervix without discharge or lesions, closed/long/high, Uterus:  enlarged, c/w 12-14 week size, and Adnexa:  normal adnexa and no mass, fullness,  tenderness  Laboratory: none  Imaging:  SLIUP at 13/6wks, FHR 140s on u/s today  Assessment: pt doing well  Plan: 1. Supervision of other normal pregnancy, antepartum Routine care. Offer afp nv - Cytology - PAP( [place]) - Genetic Screening - Culture, OB Urine - Cervicovaginal ancillary only( [place]) - CBC/D/Plt+RPR+Rh+ABO+RubIgG... - [place] MFM OB DETAIL +14 WK; Future - Hemoglobin A1c - TSH - Comprehensive metabolic panel - Protein / creatinine ratio, urine  2. [redacted] weeks gestation of pregnancy  3. Obesity in pregnancy Consider low dose ASA starting next visit - Hemoglobin A1c - TSH - Comprehensive metabolic panel - Protein / creatinine ratio, urine  4. BMI 31.0-31.9,adult  Problem list reviewed and updated.  Follow up in 4 weeks.   >50% of 35 min visit spent on counseling and coordination of care.     Cornelia Copa MD Attending Center for [facility])

## 2021-12-10 NOTE — Progress Notes (Signed)
Patient presents for a dating and viability scan.   [redacted]w[redacted]d by u/s. FHR 149

## 2021-12-10 NOTE — Progress Notes (Addendum)
NOB c/o thick white discharge.  Declined FLU vaccine.

## 2021-12-11 ENCOUNTER — Telehealth: Payer: Self-pay

## 2021-12-11 LAB — CERVICOVAGINAL ANCILLARY ONLY
Chlamydia: NEGATIVE
Comment: NEGATIVE
Comment: NORMAL
Neisseria Gonorrhea: NEGATIVE

## 2021-12-11 NOTE — Telephone Encounter (Signed)
Returned call, and answered pt questions pertaining to allergies and safe medications

## 2021-12-12 LAB — PROTEIN / CREATININE RATIO, URINE
Creatinine, Urine: 282 mg/dL
Protein, Ur: 24.8 mg/dL
Protein/Creat Ratio: 88 mg/g creat (ref 0–200)

## 2021-12-12 LAB — CULTURE, OB URINE

## 2021-12-12 LAB — URINE CULTURE, OB REFLEX

## 2021-12-14 ENCOUNTER — Encounter: Payer: Self-pay | Admitting: Obstetrics and Gynecology

## 2021-12-14 DIAGNOSIS — A5901 Trichomonal vulvovaginitis: Secondary | ICD-10-CM | POA: Insufficient documentation

## 2021-12-14 DIAGNOSIS — N879 Dysplasia of cervix uteri, unspecified: Secondary | ICD-10-CM | POA: Insufficient documentation

## 2021-12-14 DIAGNOSIS — O900 Disruption of cesarean delivery wound: Secondary | ICD-10-CM | POA: Diagnosis not present

## 2021-12-14 LAB — CYTOLOGY - PAP
Comment: NEGATIVE
Comment: NEGATIVE
Diagnosis: NEGATIVE
HPV 16: NEGATIVE
HPV 18 / 45: NEGATIVE
High risk HPV: POSITIVE — AB

## 2021-12-14 MED ORDER — METRONIDAZOLE 500 MG PO TABS: ORAL_TABLET | ORAL | 0 refills | Status: DC

## 2021-12-14 NOTE — Addendum Note (Signed)
Addended by: Palermo Bing on: 12/14/2021 09:31 PM   Modules accepted: Orders

## 2021-12-15 LAB — COMPREHENSIVE METABOLIC PANEL
ALT: 7 IU/L (ref 0–32)
AST: 16 IU/L (ref 0–40)
Albumin/Globulin Ratio: 1.2 (ref 1.2–2.2)
Albumin: 4.3 g/dL (ref 3.9–5.0)
Alkaline Phosphatase: 48 IU/L (ref 44–121)
BUN/Creatinine Ratio: 5 — ABNORMAL LOW (ref 9–23)
BUN: 4 mg/dL — ABNORMAL LOW (ref 6–20)
Bilirubin Total: <0.2 mg/dL (ref 0.0–1.2)
CO2: 14 mmol/L — ABNORMAL LOW (ref 20–29)
Calcium: 9.4 mg/dL (ref 8.7–10.2)
Chloride: 99 mmol/L (ref 96–106)
Creatinine, Ser: 0.76 mg/dL (ref 0.57–1.00)
Globulin, Total: 3.5 g/dL (ref 1.5–4.5)
Glucose: 82 mg/dL (ref 70–99)
Potassium: 3.9 mmol/L (ref 3.5–5.2)
Sodium: 134 mmol/L (ref 134–144)
Total Protein: 7.8 g/dL (ref 6.0–8.5)
eGFR: 109 mL/min/{1.73_m2} (ref >59)

## 2021-12-15 LAB — HCV INTERPRETATION

## 2021-12-15 LAB — CBC/D/PLT+RPR+RH+ABO+RUBIGG...
Antibody Screen: NEGATIVE
Basophils Absolute: 0.1 10*3/uL (ref 0.0–0.2)
Basos: 1 %
EOS (ABSOLUTE): 0.2 10*3/uL (ref 0.0–0.4)
Eos: 2 %
HCV Ab: NONREACTIVE
HIV Screen 4th Generation wRfx: NONREACTIVE
Hematocrit: 33.2 % — ABNORMAL LOW (ref 34.0–46.6)
Hemoglobin: 10.5 g/dL — ABNORMAL LOW (ref 11.1–15.9)
Hepatitis B Surface Ag: NEGATIVE
Immature Grans (Abs): 0 10*3/uL (ref 0.0–0.1)
Immature Granulocytes: 0 %
Lymphocytes Absolute: 1.4 10*3/uL (ref 0.7–3.1)
Lymphs: 15 %
MCH: 24.8 pg — ABNORMAL LOW (ref 26.6–33.0)
MCHC: 31.6 g/dL (ref 31.5–35.7)
MCV: 78 fL — ABNORMAL LOW (ref 79–97)
Monocytes Absolute: 0.6 10*3/uL (ref 0.1–0.9)
Monocytes: 6 %
Neutrophils Absolute: 6.9 10*3/uL (ref 1.4–7.0)
Neutrophils: 76 %
Platelets: 358 10*3/uL (ref 150–450)
RBC: 4.24 x10E6/uL (ref 3.77–5.28)
RDW: 14.9 % (ref 11.7–15.4)
RPR Ser Ql: NONREACTIVE
Rh Factor: POSITIVE
Rubella Antibodies, IGG: 1.48 index (ref >0.99)
WBC: 9.1 10*3/uL (ref 3.4–10.8)

## 2021-12-15 LAB — HEMOGLOBIN A1C
Est. average glucose Bld gHb Est-mCnc: 108 mg/dL
Hgb A1c MFr Bld: 5.4 % (ref 4.8–5.6)

## 2021-12-15 LAB — TSH: TSH: 0.861 u[IU]/mL (ref 0.450–4.500)

## 2021-12-17 ENCOUNTER — Encounter: Payer: Self-pay | Admitting: Obstetrics and Gynecology

## 2021-12-21 ENCOUNTER — Encounter: Payer: Self-pay | Admitting: Obstetrics and Gynecology

## 2021-12-27 ENCOUNTER — Encounter (HOSPITAL_COMMUNITY): Payer: Self-pay | Admitting: *Deleted

## 2021-12-27 ENCOUNTER — Ambulatory Visit (HOSPITAL_COMMUNITY)
Admission: EM | Admit: 2021-12-27 | Discharge: 2021-12-27 | Disposition: A | Discharge status: Home / Self Care | Payer: Medicaid Other

## 2021-12-27 DIAGNOSIS — K047 Periapical abscess without sinus: Secondary | ICD-10-CM

## 2021-12-27 DIAGNOSIS — K0889 Other specified disorders of teeth and supporting structures: Secondary | ICD-10-CM | POA: Diagnosis not present

## 2021-12-27 MED ORDER — AMOXICILLIN-POT CLAVULANATE 875-125 MG PO TABS: 1.0000 | ORAL_TABLET | Freq: Two times a day (BID) | ORAL | 0 refills | Status: DC

## 2021-12-27 NOTE — Discharge Instructions (Signed)
Take Augmentin twice daily.  I do recommend that you contact your OB/GYN and inform them of your new medication.  Use Tylenol for pain relief.  Continue with your warm compress as well as gargling with warm salt water.  Follow-up with a dentist soon as possible; call to schedule an appointment.  If you have any worsening symptoms including high fever, difficulty swallowing, shortness of breath, swelling of your throat you need to go to the emergency room. ?

## 2021-12-27 NOTE — ED Triage Notes (Signed)
Pt is [redacted] wks pregnant. Pt reports right lower toothache onset yesterday with significant worsening today. ? ? ?

## 2021-12-27 NOTE — ED Provider Notes (Signed)
?[facility] ? ? ? ?CSN: [id number] ?Arrival date & time: 12/27/21  1701 ? ? ?  ? ?History   ?Chief Complaint ?Chief Complaint  ?Patient presents with  ? Dental Pain  ?  Pt [redacted] wks pregnant  ? ? ?HPI ?Misty Curry is a 31 y.o. female.  ? ?Patient presents today with a 2-day history of right lower jaw pain.  While she has a broken tooth in this area and that area has gradually been swelling and becoming more painful.  Pain is rated 10 on a 0-10 pain scale, described as aching/throbbing, worse with attempted mastication, no alleviating factors identified.  She has had difficulty eating and drinking due to pain but denies any difficulty swallowing, throat swelling, muffled voice, shortness of breath.  Denies any recent antibiotic use.  She has been taking Tylenol without improvement of symptoms.  Denies any recent dental procedure.  She is currently [redacted] weeks pregnant. ? ? ?Past Medical History:  ?Diagnosis Date  ? Anxiety   ? Asthma   ? Vaginal Pap smear, abnormal   ? ? ?Patient Active Problem List  ? Diagnosis Date Noted  ? Cervical dysplasia 12/14/2021  ? Trichomonal vaginitis during pregnancy in first trimester 12/14/2021  ? BMI 31.0-31.9,adult 12/10/2021  ? Obesity in pregnancy 12/10/2021  ? Supervision of other normal pregnancy, antepartum 12/07/2021  ? ? ?Past Surgical History:  ?Procedure Laterality Date  ? HERNIA REPAIR    ? MOLE REMOVAL    ? ? ?OB History   ? ? Gravida  ?4  ? Para  ?1  ? Term  ?1  ? Preterm  ?   ? AB  ?1  ? Living  ?1  ?  ? ? SAB  ?1  ? IAB  ?   ? Ectopic  ?   ? Multiple  ?   ? Live Births  ?1  ?   ?  ?  ? ? ? ?Home Medications   ? ?Prior to Admission medications   ?Medication Sig Start Date End Date Taking? Authorizing Provider  ?amoxicillin-clavulanate (AUGMENTIN) 875-125 MG tablet Take 1 tablet by mouth every 12 (twelve) hours. 12/27/21  Yes Merrilee Ancona, Derry Skill, PA-C  ?Prenatal Vit-Fe Fumarate-FA (PRENATAL VITAMINS PO) Take by mouth.   Yes [provider]  ?albuterol  (VENTOLIN HFA) 108 (90 Base) MCG/ACT inhaler Inhale 1-2 puffs into the lungs every 6 (six) hours as needed for wheezing or shortness of breath. 01/09/21   Jaynee Eagles, PA-C  ?Blood Pressure Monitoring (BLOOD PRESSURE KIT) DEVI 1 kit by Does not apply route once a week. Check Blood Pressure regularly and record readings into the [employer] App.  Large Cuff.  DX O90.0 12/07/21   Shelly Bombard, MD  ?dicyclomine (BENTYL) 20 MG tablet Take 1 tablet (20 mg total) by mouth 2 (two) times daily. 12/05/14 07/04/20  Larene Pickett, PA-C  ?Ferrous Fumarate (IRON) 18 MG TBCR Take 1 tablet (18 mg total) by mouth every morning. ?Patient not taking: Reported on 12/05/2014 01/01/14 07/04/20  Lahoma Crocker, MD  ?metoCLOPramide (REGLAN) 10 MG tablet Take 1 tablet (10 mg total) by mouth 3 (three) times daily as needed for nausea (headache / nausea). 12/05/14 07/04/20  Larene Pickett, PA-C  ? ? ?Family History ?Family History  ?Problem Relation Age of Onset  ? Healthy Mother   ? Healthy Father   ? Alcohol abuse Neg Hx   ? Asthma Neg Hx   ? Arthritis Neg Hx   ? Birth  defects Neg Hx   ? Cancer Neg Hx   ? COPD Neg Hx   ? Depression Neg Hx   ? Diabetes Neg Hx   ? Drug abuse Neg Hx   ? Early death Neg Hx   ? Hearing loss Neg Hx   ? Heart disease Neg Hx   ? Hyperlipidemia Neg Hx   ? Hypertension Neg Hx   ? Kidney disease Neg Hx   ? Learning disabilities Neg Hx   ? Mental illness Neg Hx   ? Mental retardation Neg Hx   ? Miscarriages / Stillbirths Neg Hx   ? Stroke Neg Hx   ? Vision loss Neg Hx   ? Varicose Veins Neg Hx   ? ? ?Social History ?Social History  ? ?Tobacco Use  ? Smoking status: Never  ? Smokeless tobacco: Never  ?Vaping Use  ? Vaping Use: Never used  ?Substance Use Topics  ? Alcohol use: No  ? Drug use: Never  ? ? ? ?Allergies   ?Patient has no known allergies. ? ? ?Review of Systems ?Review of Systems  ?Constitutional:  Positive for activity change. Negative for appetite change, fatigue and fever.  ?HENT:  Positive for dental  problem and facial swelling. Negative for congestion, sinus pressure, sneezing, sore throat and trouble swallowing.   ?Respiratory:  Negative for cough and shortness of breath.   ?Cardiovascular:  Negative for chest pain.  ?Gastrointestinal:  Negative for abdominal pain, diarrhea, nausea and vomiting.  ?Neurological:  Positive for headaches. Negative for dizziness and light-headedness.  ? ? ?Physical Exam ?Triage Vital Signs ?ED Triage Vitals  ?Enc Vitals Group  ?   BP 12/27/21 1721 123/85  ?   Pulse Rate 12/27/21 1721 85  ?   Resp 12/27/21 1721 16  ?   Temp 12/27/21 1721 98.7 ?F (37.1 ?C)  ?   Temp Source 12/27/21 1721 Oral  ?   SpO2 12/27/21 1721 99 %  ?   Weight --   ?   Height --   ?   Head Circumference --   ?   Peak Flow --   ?   Pain Score 12/27/21 1723 10  ?   Pain Loc --   ?   Pain Edu? --   ?   Excl. in GC? --   ? ?No data found. ? ?Updated Vital Signs ?BP 123/85   Pulse 85   Temp 98.7 ?F (37.1 ?C) (Oral)   Resp 16   LMP 08/28/2021   SpO2 99%   Breastfeeding No  ? ?Visual Acuity ?Right Eye Distance:   ?Left Eye Distance:   ?Bilateral Distance:   ? ?Right Eye Near:   ?Left Eye Near:    ?Bilateral Near:    ? ?Physical Exam ?Vitals reviewed.  ?Constitutional:   ?   General: She is awake. She is not in acute distress. ?   Appearance: Normal appearance. She is well-developed. She is not ill-appearing.  ?   Comments: Very pleasant female appears stated age in no acute distress sitting comfortably in exam room  ?HENT:  ?   Head: Normocephalic and atraumatic.  ?   Mouth/Throat:  ?   Dentition: Abnormal dentition. Dental tenderness, gingival swelling and dental abscesses present.  ?   Tongue: Tongue does not deviate from midline.  ?   Pharynx: Uvula midline. No oropharyngeal exudate or posterior oropharyngeal erythema.  ? ?   Comments: No evidence of Ludwig angina on exam ?Cardiovascular:  ?  Rate and Rhythm: Normal rate and regular rhythm.  ?   Heart sounds: Normal heart sounds, S1 normal and S2 normal. No  murmur heard. ?Pulmonary:  ?   Effort: Pulmonary effort is normal.  ?   Breath sounds: Normal breath sounds. No wheezing, rhonchi or rales.  ?   Comments: Clear to auscultation bilaterally ?Abdominal:  ?   Palpations: Abdomen is soft.  ?   Tenderness: There is no abdominal tenderness.  ?Psychiatric:     ?   Behavior: Behavior is cooperative.  ? ? ? ?UC Treatments / Results  ?Labs ?(all labs ordered are listed, but only abnormal results are displayed) ?Labs Reviewed - No data to display ? ?EKG ? ? ?Radiology ?No results found. ? ?Procedures ?Procedures (including critical care time) ? ?Medications Ordered in UC ?Medications - No data to display ? ?Initial Impression / Assessment and Plan / UC Course  ?I have reviewed the triage vital signs and the nursing notes. ? ?Pertinent labs & imaging results that were available during my care of the patient were reviewed by me and considered in my medical decision making (see chart for details). ? ?  ? ?Patient was started on Augmentin twice daily for 7 days.  Discussed that this is generally considered safe in pregnancy as long as it is not given around the time of delivery but recommended she still contact her OB/GYN and inform them of her new medication.  Because she is pregnant we are limited in the medication that became given and recommended she continue same Tylenol.  She can use conservative treatment measures including gargling with warm salt water and using warm compresses for symptom relief.  Recommend she follow-up with a dentist and was given contact information for local provider.  Discussed alarm symptoms that warrant emergent evaluation including difficulty speaking, difficulty swallowing, swelling of her throat, fever, nausea/vomiting.  Strict return precautions given to which she expressed understanding. ? ?Final Clinical Impressions(s) / UC Diagnoses  ? ?Final diagnoses:  ?Dental abscess  ?Dental infection  ?Pain, dental  ? ? ? ?Discharge Instructions   ? ?   ?Take Augmentin twice daily.  I do recommend that you contact your OB/GYN and inform them of your new medication.  Use Tylenol for pain relief.  Continue with your warm compress as well as gargling with warm sa

## 2022-01-14 ENCOUNTER — Encounter: Payer: Self-pay | Admitting: Obstetrics and Gynecology

## 2022-01-14 ENCOUNTER — Telehealth (INDEPENDENT_AMBULATORY_CARE_PROVIDER_SITE_OTHER): Payer: Medicaid Other | Admitting: Obstetrics and Gynecology

## 2022-01-14 VITALS — BP 126/76 | HR 101 | Wt 185.0 lb

## 2022-01-14 DIAGNOSIS — Z3A18 18 weeks gestation of pregnancy: Secondary | ICD-10-CM

## 2022-01-14 DIAGNOSIS — O99212 Obesity complicating pregnancy, second trimester: Secondary | ICD-10-CM

## 2022-01-14 DIAGNOSIS — O99210 Obesity complicating pregnancy, unspecified trimester: Secondary | ICD-10-CM

## 2022-01-14 DIAGNOSIS — Z3480 Encounter for supervision of other normal pregnancy, unspecified trimester: Secondary | ICD-10-CM

## 2022-01-14 MED ORDER — ALBUTEROL SULFATE HFA 108 (90 BASE) MCG/ACT IN AERS: 1.0000 | INHALATION_SPRAY | Freq: Four times a day (QID) | RESPIRATORY_TRACT | 2 refills | Status: DC | PRN

## 2022-01-14 MED ORDER — ASPIRIN EC 81 MG PO TBEC: 81.0000 mg | DELAYED_RELEASE_TABLET | Freq: Every day | ORAL | 2 refills | Status: DC

## 2022-01-14 NOTE — Progress Notes (Signed)
I connected with  Misty Curry on 01/14/22 by a video enabled telemedicine application and verified that I am speaking with the correct person using two identifiers. ?  ?I discussed the limitations of evaluation and management by telemedicine. The patient expressed understanding and agreed to proceed.  ? ?[facility], reports no problems today.  ?

## 2022-01-14 NOTE — Progress Notes (Signed)
? ?  OBSTETRICS PRENATAL VIRTUAL VISIT ENCOUNTER NOTE ? ?Provider location: Center for [facility] at [place]  ? ?Patient location: Home ? ?I connected with Misty Curry on 01/14/22 at  3:50 PM EDT by MyChart Video Encounter and verified that I am speaking with the correct person using two identifiers. I discussed the limitations, risks, security and privacy concerns of performing an evaluation and management service virtually and the availability of in person appointments. I also discussed with the patient that there may be a patient responsible charge related to this service. The patient expressed understanding and agreed to proceed. ?Subjective:  ?Misty Curry is a 31 y.o. G4P1011 at [redacted]w[redacted]d being seen today for ongoing prenatal care.  She is currently monitored for the following issues for this low-risk pregnancy and has Supervision of other normal pregnancy, antepartum; BMI 31.0-31.9,adult; Obesity in pregnancy; Cervical dysplasia; and Trichomonal vaginitis during pregnancy in first trimester on their problem list. ? ?Patient reports no complaints.  Contractions: Not present. Vag. Bleeding: None.   . Denies any leaking of fluid.  ? ?The following portions of the patient's history were reviewed and updated as appropriate: allergies, current medications, past family history, past medical history, past social history, past surgical history and problem list.  ? ?Objective:  ? ?Vitals:  ? 01/14/22 1315  ?BP: 126/76  ?Pulse: (!) 101  ?Weight: 185 lb (83.9 kg)  ? ? ?Fetal Status:          ? ?General:  Alert, oriented and cooperative. Patient is in no acute distress.  ?Respiratory: Normal respiratory effort, no problems with respiration noted  ?Mental Status: Normal mood and affect. Normal behavior. Normal judgment and thought content.  ?Rest of physical exam deferred due to type of encounter ? ?Imaging: ?No results found. ? ?Assessment and Plan:  ?Pregnancy: G4P1011 at [redacted]w[redacted]d ?1. Supervision of other  normal pregnancy, antepartum ?Patient is doing well without complaints ?Anatomy ultrasound scheduled on 01/17/22 ? ?2. Obesity in pregnancy ?Rx ASA provided ?Normal A1C ? ?Preterm labor symptoms and general obstetric precautions including but not limited to vaginal bleeding, contractions, leaking of fluid and fetal movement were reviewed in detail with the patient. ?I discussed the assessment and treatment plan with the patient. The patient was provided an opportunity to ask questions and all were answered. The patient agreed with the plan and demonstrated an understanding of the instructions. The patient was advised to call back or seek an in-person office evaluation/go to [facility]U at [facility] for any urgent or concerning symptoms. ?Please refer to After Visit Summary for other counseling recommendations.  ? ?I provided 15 minutes of face-to-face time during this encounter. ? ?No follow-ups on file. ? ?Future Appointments  ?Date Time Provider [place]  ?01/14/2022  3:50 PM Tynell Winchell, Vickii Chafe, MD [place] None  ?01/17/2022  2:15 PM [facility] NURSE [facility]  ?01/17/2022  2:30 PM [facility] US2 [facility] [place]  ? ? ?Mora Bellman, MD ?Center for [facility], [place]  ?

## 2022-01-17 ENCOUNTER — Ambulatory Visit: Payer: Medicaid Other | Admitting: *Deleted

## 2022-01-17 ENCOUNTER — Ambulatory Visit: Payer: Medicaid Other | Attending: Obstetrics and Gynecology

## 2022-01-17 ENCOUNTER — Other Ambulatory Visit: Payer: Self-pay | Admitting: *Deleted

## 2022-01-17 VITALS — BP 128/68 | HR 87

## 2022-01-17 DIAGNOSIS — Z362 Encounter for other antenatal screening follow-up: Secondary | ICD-10-CM | POA: Insufficient documentation

## 2022-01-17 DIAGNOSIS — Z3480 Encounter for supervision of other normal pregnancy, unspecified trimester: Secondary | ICD-10-CM | POA: Insufficient documentation

## 2022-01-17 DIAGNOSIS — Z3A19 19 weeks gestation of pregnancy: Secondary | ICD-10-CM | POA: Insufficient documentation

## 2022-01-17 DIAGNOSIS — O43122 Velamentous insertion of umbilical cord, second trimester: Secondary | ICD-10-CM | POA: Insufficient documentation

## 2022-01-17 DIAGNOSIS — O99212 Obesity complicating pregnancy, second trimester: Secondary | ICD-10-CM | POA: Diagnosis not present

## 2022-01-17 DIAGNOSIS — Z363 Encounter for antenatal screening for malformations: Secondary | ICD-10-CM | POA: Diagnosis present

## 2022-01-17 DIAGNOSIS — O43192 Other malformation of placenta, second trimester: Secondary | ICD-10-CM

## 2022-01-18 ENCOUNTER — Encounter: Payer: Self-pay | Admitting: Obstetrics and Gynecology

## 2022-01-18 DIAGNOSIS — O43199 Other malformation of placenta, unspecified trimester: Secondary | ICD-10-CM | POA: Insufficient documentation

## 2022-01-31 ENCOUNTER — Inpatient Hospital Stay (HOSPITAL_COMMUNITY)
Admission: AD | Admit: 2022-01-31 | Discharge: 2022-01-31 | Disposition: A | Discharge status: Home / Self Care | Payer: Medicaid Other | Attending: Obstetrics and Gynecology | Admitting: Obstetrics and Gynecology

## 2022-01-31 DIAGNOSIS — Z3A21 21 weeks gestation of pregnancy: Secondary | ICD-10-CM

## 2022-01-31 DIAGNOSIS — O99712 Diseases of the skin and subcutaneous tissue complicating pregnancy, second trimester: Secondary | ICD-10-CM | POA: Diagnosis not present

## 2022-01-31 DIAGNOSIS — L309 Dermatitis, unspecified: Secondary | ICD-10-CM | POA: Diagnosis not present

## 2022-01-31 DIAGNOSIS — O99891 Other specified diseases and conditions complicating pregnancy: Secondary | ICD-10-CM | POA: Diagnosis present

## 2022-01-31 DIAGNOSIS — Z3480 Encounter for supervision of other normal pregnancy, unspecified trimester: Secondary | ICD-10-CM

## 2022-01-31 MED ORDER — HYDROXYZINE PAMOATE 25 MG PO CAPS: 25.0000 mg | ORAL_CAPSULE | Freq: Four times a day (QID) | ORAL | 0 refills | Status: DC | PRN

## 2022-01-31 MED ORDER — TRIAMCINOLONE ACETONIDE 0.1 % EX CREA: 1.0000 "application " | TOPICAL_CREAM | Freq: Two times a day (BID) | CUTANEOUS | 0 refills | Status: DC

## 2022-01-31 NOTE — MAU Note (Signed)
...  Misty Curry is a 31 y.o. at [redacted]w[redacted]d here in MAU reporting: Itching all over her body for two weeks now. She states she initially thought it was related to her eczema but it has gotten so bad now it is hard for her to sleep at night. She states her skin feels consistently dry. She states she itches from her head to her toes. Denies pain. No VB or LOF.  ? ?She states she initially started taking Benadryl and it did not aid in her itching but only made her sleepy. ? ?Pain score: Denies pain. ? ?FHT: 133 doppler ? ? ?

## 2022-01-31 NOTE — MAU Provider Note (Signed)
?History  ?  ? ?CSN: [id number] ? ?Arrival date and time: 01/31/22 135 ? ? ?31 year old G4 P1-0-1-1 at 21.0 weeks presenting with itching.  Reports itching of skin onset 2 weeks ago.  Affected areas are wrists, back of knees, calves.  Prior to symptom onset she denies any new skin products or detergents.  Since symptom onset she has tried oatmeal tub soaks, Benadryl, and switch to mild baby wash.  Itching is worse at night.  Reports history of eczema as a child.  Denies pregnancy complaints.  Reports positive FM. ? ?OB History   ? ? Gravida  ?4  ? Para  ?1  ? Term  ?1  ? Preterm  ?   ? AB  ?1  ? Living  ?1  ?  ? ? SAB  ?1  ? IAB  ?   ? Ectopic  ?   ? Multiple  ?   ? Live Births  ?1  ?   ?  ?  ? ? ?Past Medical History:  ?Diagnosis Date  ? Anxiety   ? Asthma   ? Vaginal Pap smear, abnormal   ? ? ?Past Surgical History:  ?Procedure Laterality Date  ? HERNIA REPAIR    ? MOLE REMOVAL    ? ? ?Family History  ?Problem Relation Age of Onset  ? Healthy Mother   ? Healthy Father   ? Alcohol abuse Neg Hx   ? Asthma Neg Hx   ? Arthritis Neg Hx   ? Birth defects Neg Hx   ? Cancer Neg Hx   ? COPD Neg Hx   ? Depression Neg Hx   ? Diabetes Neg Hx   ? Drug abuse Neg Hx   ? Early death Neg Hx   ? Hearing loss Neg Hx   ? Heart disease Neg Hx   ? Hyperlipidemia Neg Hx   ? Hypertension Neg Hx   ? Kidney disease Neg Hx   ? Learning disabilities Neg Hx   ? Mental illness Neg Hx   ? Mental retardation Neg Hx   ? Miscarriages / Stillbirths Neg Hx   ? Stroke Neg Hx   ? Vision loss Neg Hx   ? Varicose Veins Neg Hx   ? ? ?Social History  ? ?Tobacco Use  ? Smoking status: Never  ? Smokeless tobacco: Never  ?Vaping Use  ? Vaping Use: Never used  ?Substance Use Topics  ? Alcohol use: No  ? Drug use: Never  ? ? ?Allergies: No Known Allergies ? ?Medications Prior to Admission  ?Medication Sig Dispense Refill Last Dose  ? albuterol (VENTOLIN HFA) 108 (90 Base) MCG/ACT inhaler Inhale 1-2 puffs into the lungs every 6 (six) hours as needed for  wheezing or shortness of breath. 18 g 2   ? aspirin EC 81 MG tablet Take 1 tablet (81 mg total) by mouth daily. Take after 12 weeks for prevention of preeclampsia later in pregnancy (Patient not taking: Reported on 01/17/2022) 300 tablet 2   ? Blood Pressure Monitoring (BLOOD PRESSURE KIT) DEVI 1 kit by Does not apply route once a week. Check Blood Pressure regularly and record readings into the Babyscripts App.  Large Cuff.  DX O90.0 1 each 0   ? Prenatal Vit-Fe Fumarate-FA (PRENATAL VITAMINS PO) Take by mouth.     ? ? ?Review of Systems  ?Constitutional:  Negative for chills and fever.  ?Gastrointestinal:  Negative for abdominal pain.  ?Genitourinary:  Negative for vaginal bleeding.  ?Skin:  Positive  for rash.  ?Physical Exam  ? ?Blood pressure 124/69, pulse 96, temperature 98.5 ?F (36.9 ?C), temperature source Oral, resp. rate 18, height _0  (1.651 m), weight 85.8 kg, last menstrual period 08/28/2021, SpO2 100 %. ? ?Physical Exam ?Vitals and nursing note reviewed.  ?Constitutional:   ?   General: She is not in acute distress. ?   Appearance: Normal appearance.  ?HENT:  ?   Head: Normocephalic and atraumatic.  ?Cardiovascular:  ?   Rate and Rhythm: Normal rate.  ?Pulmonary:  ?   Effort: Pulmonary effort is normal. No respiratory distress.  ?Musculoskeletal:     ?   General: Normal range of motion.  ?   Cervical back: Normal range of motion.  ?Skin: ? ?    ?Neurological:  ?   Mental Status: She is alert.  ?FHT 133 ? ?MAU Course  ?Procedures ? ?MDM ?Rash consistent with eczema.  Will start steroid lotion and Vistaril prn itching.  Recommend follow-up with PCP and/or Derm.  Stable for discharge. ? ?Assessment and Plan  ? ?1. Supervision of other normal pregnancy, antepartum   ?2. [redacted] weeks gestation of pregnancy   ?3. Eczema, unspecified type   ? ?Discharge home ?Follow-up at [facility] as scheduled ?Return precautions ? ?Allergies as of 01/31/2022   ?No Known Allergies ?  ? ?  ?Medication List  ?  ? ?TAKE these  medications   ? ?albuterol 108 (90 Base) MCG/ACT inhaler ?Commonly known as: VENTOLIN HFA ?Inhale 1-2 puffs into the lungs every 6 (six) hours as needed for wheezing or shortness of breath. ?  ?aspirin EC 81 MG tablet ?Take 1 tablet (81 mg total) by mouth daily. Take after 12 weeks for prevention of preeclampsia later in pregnancy ?  ?Blood Pressure Kit Devi ?1 kit by Does not apply route once a week. Check Blood Pressure regularly and record readings into the Babyscripts App.  Large Cuff.  DX O90.0 ?  ?hydrOXYzine 25 MG capsule ?Commonly known as: Vistaril ?Take 1 capsule (25 mg total) by mouth every 6 (six) hours as needed for itching. ?  ?PRENATAL VITAMINS PO ?Take by mouth. ?  ?triamcinolone cream 0.1 % ?Commonly known as: KENALOG ?Apply 1 application. topically 2 (two) times daily. ?  ? ?  ? ? ?Julianne Handler, CNM ?01/31/2022, 11:51 AM  ?

## 2022-02-11 ENCOUNTER — Encounter (HOSPITAL_COMMUNITY): Payer: Self-pay | Admitting: Obstetrics and Gynecology

## 2022-02-11 ENCOUNTER — Inpatient Hospital Stay (HOSPITAL_COMMUNITY)
Admission: AD | Admit: 2022-02-11 | Discharge: 2022-02-11 | Disposition: A | Discharge status: Home / Self Care | Payer: Medicaid Other | Attending: Obstetrics and Gynecology | Admitting: Obstetrics and Gynecology

## 2022-02-11 ENCOUNTER — Telehealth (INDEPENDENT_AMBULATORY_CARE_PROVIDER_SITE_OTHER): Payer: Medicaid Other | Admitting: Advanced Practice Midwife

## 2022-02-11 ENCOUNTER — Other Ambulatory Visit: Payer: Self-pay

## 2022-02-11 VITALS — BP 127/72

## 2022-02-11 DIAGNOSIS — O4692 Antepartum hemorrhage, unspecified, second trimester: Secondary | ICD-10-CM | POA: Insufficient documentation

## 2022-02-11 DIAGNOSIS — O26892 Other specified pregnancy related conditions, second trimester: Secondary | ICD-10-CM | POA: Insufficient documentation

## 2022-02-11 DIAGNOSIS — O23599 Infection of other part of genital tract in pregnancy, unspecified trimester: Secondary | ICD-10-CM

## 2022-02-11 DIAGNOSIS — B379 Candidiasis, unspecified: Secondary | ICD-10-CM | POA: Diagnosis not present

## 2022-02-11 DIAGNOSIS — Z3A22 22 weeks gestation of pregnancy: Secondary | ICD-10-CM | POA: Diagnosis not present

## 2022-02-11 DIAGNOSIS — A5901 Trichomonal vulvovaginitis: Secondary | ICD-10-CM

## 2022-02-11 DIAGNOSIS — Z5329 Procedure and treatment not carried out because of patient's decision for other reasons: Secondary | ICD-10-CM | POA: Diagnosis present

## 2022-02-11 DIAGNOSIS — L309 Dermatitis, unspecified: Secondary | ICD-10-CM

## 2022-02-11 DIAGNOSIS — Z3480 Encounter for supervision of other normal pregnancy, unspecified trimester: Secondary | ICD-10-CM

## 2022-02-11 DIAGNOSIS — K047 Periapical abscess without sinus: Secondary | ICD-10-CM | POA: Diagnosis not present

## 2022-02-11 DIAGNOSIS — O99712 Diseases of the skin and subcutaneous tissue complicating pregnancy, second trimester: Secondary | ICD-10-CM

## 2022-02-11 DIAGNOSIS — O98812 Other maternal infectious and parasitic diseases complicating pregnancy, second trimester: Secondary | ICD-10-CM | POA: Insufficient documentation

## 2022-02-11 LAB — WET PREP, GENITAL
Clue Cells Wet Prep HPF POC: NONE SEEN
Sperm: NONE SEEN
Trich, Wet Prep: NONE SEEN
WBC, Wet Prep HPF POC: <10 (ref <10)

## 2022-02-11 MED ORDER — TERCONAZOLE 0.4 % VA CREA
1.0000 | TOPICAL_CREAM | Freq: Every day | VAGINAL | 0 refills | Status: DC
Start: 2022-02-11 — End: 2022-05-15

## 2022-02-11 MED ORDER — AMOXICILLIN-POT CLAVULANATE 875-125 MG PO TABS: 1.0000 | ORAL_TABLET | Freq: Two times a day (BID) | ORAL | 0 refills | Status: DC

## 2022-02-11 NOTE — Progress Notes (Signed)
? ?OBSTETRICS PRENATAL VIRTUAL VISIT ENCOUNTER NOTE ? ?Provider location: [facility] [place]  ? ?Patient location: Home ? ?I connected with Misty Curry on 02/11/22 at 11:15 AM EDT by MyChart Video Encounter and verified that I am speaking with the correct person using two identifiers. I discussed the limitations, risks, security and privacy concerns of performing an evaluation and management service virtually and the availability of in person appointments. I also discussed with the patient that there may be a patient responsible charge related to this service. The patient expressed understanding and agreed to proceed. ?Subjective:  ?Misty Curry is a 31 y.o. G4P1011 at [redacted]w[redacted]d being seen today for ongoing prenatal care.  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 ? ?Patient reports no complaints.  Contractions: Not present. Vag. Bleeding: None.  Movement: Present. Denies any leaking of fluid.  ? ?The following portions of the patient's history were reviewed and updated as appropriate: allergies, current medications, past family history, past medical history, past social history, past surgical history and problem list.  ? ?Objective:  ? ?Vitals:  ? 02/11/22 0859  ?BP: 127/72  ? ? ?Fetal Status:     Movement: Present    ? ?General:  Alert, oriented and cooperative. Patient is in no acute distress.  ?Respiratory: Normal respiratory effort, no problems with respiration noted  ?Mental Status: Normal mood and affect. Normal behavior. Normal judgment and thought content.  ?Rest of physical exam deferred due to type of encounter ? ?Imaging: ?[place] MFM OB DETAIL +14 WK ? ?Result Date:  01/17/2022 ?----------------------------------------------------------------------  OBSTETRICS REPORT                       (Signed Final 01/17/2022 03:50 pm) ---------------------------------------------------------------------- Patient Info  ID #:       [id number]                          D.O.B.:  03/14/1991 (31 yrs)  Name:       Misty Curry             Visit Date: 01/17/2022 03:00 pm ---------------------------------------------------------------------- Performed By  Attending:        Tama High MD        Ref. Address:     [place], [place]  [place]  Performed By:     Eveline Keto         [facility]                                                             [facility]                                                             [facility]  Referred By:      Aletha Halim MD ---------------------------------------------------------------------- Orders  #  Description                           Code        Ordered By  1  [place] MFM OB DETAIL +14 [place]               76811.01    CHARLIE PICKENS ----------------------------------------------------------------------  #  Order #                     Accession #                Episode #  1  [id number]                   [id number]                 [id number] ---------------------------------------------------------------------- Indications  Obesity complicating pregnancy, second         O99.212  trimester (BMI 31)  Marginal insertion of umbilical cord affecting O43.192  management of mother in second trimester  [redacted] weeks gestation of pregnancy                Z3A.19  Antenatal screening for malformations          Z36.3  LR NIPS  ---------------------------------------------------------------------- Fetal Evaluation  Num Of Fetuses:         1  Fetal Heart Rate(bpm):  136  Cardiac Activity:       Observed  Presentation:           Breech  Placenta:               Posterior  P. Cord Insertion:      Marginal insertion  Amniotic Fluid  AFI FV:      Within normal limits ---------------------------------------------------------------------- Biometry  BPD:      43.7  mm     G. Age:  19w 2d         60  %    CI:  71.4   %    70 - 86                                                          FL/HC:      18.3   %    16.1 - 18.3  HC:      164.7  mm     G. Age:  19w 1d         52  %    HC/AC:      1.16        1.09 - 1.39  AC:      141.8  mm     G. Age:  19w 4d         64  %    FL/BPD:     69.1   %  FL:       30.2  mm     G. Age:  19w 2d         56  %    FL/AC:      21.3   %    20 - 24  CER:      19.2  mm     G. Age:  18w 5d         23  %  NFT:       3.8  mm  LV:        6.4  mm  CM:        2.4  mm  Est. FW:     291  gm    0 lb 10 oz      70  % ---------------------------------------------------------------------- OB History  Gravidity:    3         Term:   1        Prem:   0        SAB:   1  TOP:          0       Ectopic:  0        Living: 1 ---------------------------------------------------------------------- Gestational Age  LMP:           20w 2d        Date:  08/28/21                 EDD:   06/04/22  U/S Today:     19w 2d                                        EDD:   06/11/22  Best:          19w 0d     Det. By:  U/S C R L  (12/10/21)    EDD:   06/13/22 ---------------------------------------------------------------------- Anatomy  Cranium:               Appears normal         Aortic Arch:            Appears normal  Cavum:                 Appears normal         Ductal Arch:  Appears normal  Ventricles:            Appears normal         Diaphragm:              Appears normal  Choroid Plexus:        Appears normal         Stomach:                 Appears normal, left                                                                        sided  Cerebellum:            Appears normal         Abdomen:                Appears normal  Posterior Fossa:       Appears normal         Abdominal Wall:         Appears nml (cord                                                                        insert, abd wall)  Nuchal Fold:           Appears normal         Cord Vessels:           Appears normal (3                                                                        vessel cord)  Face:                  Appears normal         Kidneys:                Appear normal                         (orbits and profile)  Lips:                  Appears normal         Bladder:                Appears normal  Thoracic:              Appears normal         Spine:                  Appears normal  Heart:                 Appears normal  Upper Extremities:      Appears normal                         (4CH, axis, and                         situs)  RVOT:                  Appears normal         Lower Extremities:      Appears normal  LVOT:                  Appears normal  Other:  VC, 3VV and 3VTV visualized. Nasal bone, lenses, maxilla, mandible          and falx visualized.  Heels/feet and open hands/5th digits visualized.          Fetus appears to be a female. ---------------------------------------------------------------------- Cervix Uterus Adnexa  Cervix  Length:           3.76  cm.  Normal appearance by transabdominal scan.  Uterus  No abnormality visualized.  Right Ovary  Within normal limits.  Left Ovary  Within normal limits. ---------------------------------------------------------------------- Impression  G4 P1. Patient is here for fetal anatomy scan.  Obstetric history is significant for a term vaginal delivery.  On cell-free fetal DNA screening, the risks of fetal  aneuploidies are not increased .  We performed fetal anatomy scan. No makers of  aneuploidies or fetal  structural defects are seen. Fetal  biometry is consistent with her previously-established dates.  Amniotic fluid is normal and good fetal activity is seen.  Marginal cord insertion is seen. I explained the finding that  marginal cord insertion is n

## 2022-02-11 NOTE — MAU Note (Signed)
Pt says at 8a woke with right side tooth pain- face was swollen-  ?She called [facility]- she took reg Tyl 2 tabs -  ?Slept through day.  ?Woke at 540pm- to b-room - wiped and saw pink D/C  ?Denies cramping - no pain ?Just went to b-room in lobby- saw nothing . ?

## 2022-02-11 NOTE — Progress Notes (Signed)
I connected with  Misty Curry on 02/11/22 by a video enabled telemedicine application and verified that I am speaking with the correct person using two identifiers. ?  ?I discussed the limitations of evaluation and management by telemedicine. The patient expressed understanding and agreed to proceed.  ? ?Mychart OB, reports no complaints Today. She is not able to check her vitals at this time. ?

## 2022-02-11 NOTE — MAU Provider Note (Addendum)
Patient Misty Curry is a  30 y.o. [phone] ? At 54w4dhere with concern for vaginal bleeding that started at 1745 after she had a BM. It was pink on her toilet paper. It did not recur; she denies pain, contractions, decreased fetal movements. She denies fever, SOB, chest pain. She feels otherwise "great".  ?She had intercourse 48 hours ago.  ? ?She reports that her tooth started hurting last night; her tooth broke off two months ago. She reports that the pain is radiating to her jaw. She took tylenol at 2 pm; she reports that it is in "the jaw".  ? ?History  ?  ? ?CSN: [id number]? ?Arrival date and time: 02/11/22 1912 ? ? Event Date/Time  ? First Provider Initiated Contact with Patient 02/11/22 2008   ?  ? ?Chief Complaint  ?Patient presents with  ? Vaginal Bleeding  ? ?Vaginal Bleeding ?The patient's primary symptoms include vaginal bleeding. This is a new problem. The current episode started today. The problem occurs rarely. The problem has been resolved. The patient is experiencing no pain. She is pregnant. Pertinent negatives include no abdominal pain, constipation, diarrhea, dysuria, fever, nausea, urgency or vomiting. The vaginal discharge was bloody. The vaginal bleeding is spotting. She has not been passing clots. She has not been passing tissue.  ? ?OB History   ? ? Gravida  ?4  ? Para  ?1  ? Term  ?1  ? Preterm  ?   ? AB  ?1  ? Living  ?1  ?  ? ? SAB  ?1  ? IAB  ?   ? Ectopic  ?   ? Multiple  ?   ? Live Births  ?1  ?   ?  ?  ? ? ?Past Medical History:  ?Diagnosis Date  ? Anxiety   ? Asthma   ? Vaginal Pap smear, abnormal   ? ? ?Past Surgical History:  ?Procedure Laterality Date  ? HERNIA REPAIR    ? MOLE REMOVAL    ? ? ?Family History  ?Problem Relation Age of Onset  ? Healthy Mother   ? Healthy Father   ? Alcohol abuse Neg Hx   ? Asthma Neg Hx   ? Arthritis Neg Hx   ? Birth defects Neg Hx   ? Cancer Neg Hx   ? COPD Neg Hx   ? Depression Neg Hx   ? Diabetes Neg Hx   ? Drug abuse Neg Hx   ? Early death  Neg Hx   ? Hearing loss Neg Hx   ? Heart disease Neg Hx   ? Hyperlipidemia Neg Hx   ? Hypertension Neg Hx   ? Kidney disease Neg Hx   ? Learning disabilities Neg Hx   ? Mental illness Neg Hx   ? Mental retardation Neg Hx   ? Miscarriages / Stillbirths Neg Hx   ? Stroke Neg Hx   ? Vision loss Neg Hx   ? Varicose Veins Neg Hx   ? ? ?Social History  ? ?Tobacco Use  ? Smoking status: Never  ? Smokeless tobacco: Never  ?Vaping Use  ? Vaping Use: Never used  ?Substance Use Topics  ? Alcohol use: No  ? Drug use: Never  ? ? ?Allergies: No Known Allergies ? ?Medications Prior to Admission  ?Medication Sig Dispense Refill Last Dose  ? albuterol (VENTOLIN HFA) 108 (90 Base) MCG/ACT inhaler Inhale 1-2 puffs into the lungs every 6 (six) hours as needed for wheezing  or shortness of breath. 18 g 2 Past Week  ? Prenatal Vit-Fe Fumarate-FA (PRENATAL VITAMINS PO) Take by mouth.   02/11/2022  ? triamcinolone cream (KENALOG) 0.1 % Apply 1 application. topically 2 (two) times daily. 30 g 0 02/11/2022  ? aspirin EC 81 MG tablet Take 1 tablet (81 mg total) by mouth daily. Take after 12 weeks for prevention of preeclampsia later in pregnancy (Patient not taking: Reported on 01/17/2022) 300 tablet 2   ? Blood Pressure Monitoring (BLOOD PRESSURE KIT) DEVI 1 kit by Does not apply route once a week. Check Blood Pressure regularly and record readings into the Babyscripts App.  Large Cuff.  DX O90.0 1 each 0   ? hydrOXYzine (VISTARIL) 25 MG capsule Take 1 capsule (25 mg total) by mouth every 6 (six) hours as needed for itching. 30 capsule 0   ? ? ?Review of Systems  ?Constitutional: Negative.  Negative for fever.  ?HENT: Negative.    ?Respiratory: Negative.    ?Cardiovascular: Negative.   ?Gastrointestinal:  Negative for abdominal pain, constipation, diarrhea, nausea and vomiting.  ?Genitourinary:  Positive for vaginal bleeding. Negative for dysuria and urgency.  ?Neurological: Negative.   ?Hematological: Negative.   ?Psychiatric/Behavioral: Negative.     ?Physical Exam  ? ?Blood pressure 137/67, pulse 94, temperature 98.1 ?F (36.7 ?C), temperature source Oral, resp. rate 20, height 5' 5" (1.651 m), weight 86.5 kg, last menstrual period 08/28/2021. ? ?Physical Exam ?Constitutional:   ?   Appearance: Normal appearance.  ?HENT:  ?   Head:  ?   Comments: There is reddened irritated gumline where right molar is missing; no pus, no drainage.  ?Cardiovascular:  ?   Rate and Rhythm: Normal rate.  ?Pulmonary:  ?   Effort: Pulmonary effort is normal.  ?Abdominal:  ?   General: Abdomen is flat.  ?Genitourinary: ?   General: Normal vulva.  ?   Comments: NEFG; thick white discharge in the vagina, no blood, no CMT or suprapubic tenderness.  ?Musculoskeletal:     ?   General: Normal range of motion.  ?Skin: ?   General: Skin is warm.  ?Neurological:  ?   General: No focal deficit present.  ?   Mental Status: She is alert.  ?Psychiatric:     ?   Mood and Affect: Mood normal.     ?   Behavior: Behavior normal.  ? ? ?MAU Course  ?Procedures ? ?MDM ?-FHT present by Doppler ?-wet prep is shows yeast ? ?Assessment and Plan  ? ?1. Supervision of other normal pregnancy, antepartum   ?2. [redacted] weeks gestation of pregnancy   ?3. Tooth infection   ?4. Yeast infection   ? ?-RX given for terazole and antibiotics ?-list of dental practices given  ?-keep fup appt ?-encouraged patient to keep appt for [place] and provider appt end of the week ? ?Mervyn Skeeters Kooistra ?02/11/2022, 8:21 PM  ?

## 2022-02-11 NOTE — Discharge Instructions (Signed)
?  ?  Dental Resources   [place]   Dr. Carl Little  Exam $85   [address]  Extraction $120 and up   [place], [place]  *full list of prices available*   [phone]      [facility]  Exam $94   [address]  Exam w/ Xrays $380   [place], [place]  Xrays $68 and up   [phone]  Cleaning $101   Extraction $190 and up      [facility]  Cleaning + Xray $344   [address]  Extraction- pt has to be seen first to give price   [place], [place]   [phone]     [place]   Dr. Ben Turner/Dr. Clay Burton  Exam, Cleaning, Xray $262   [address]  Extraction $207-$317   [place]   [phone]      [facility]  Cleaning $5   [address]  Xray $5   [place], [place] [address]  Call to get on waiting list   [phone] ext [phone]     Dr. James McMasters/Dr. Eric Sadler   [address]  Xray $85 Each   [place], [place] [address]  Extraction $200    Dr. Stacey Green  Extraction $300 per tooth   [address]   [place], [place] [address]   [phone]      Dr. Janna Civils  Cleaning $300   [address]  Extraction $273   [place], [place] [address]   [phone]     [place]   [facility]  Emergency Exam $65   [address]  Cleaning & Exam $150   [place], [place] [address]  Extractions: Simple $180 Surgical $250   [phone]  Fillings $150-$225     

## 2022-02-12 LAB — GC/CHLAMYDIA PROBE AMP (~~LOC~~) NOT AT ARMC
Chlamydia: NEGATIVE
Comment: NEGATIVE
Comment: NORMAL
Neisseria Gonorrhea: NEGATIVE

## 2022-02-27 ENCOUNTER — Encounter: Payer: Self-pay | Admitting: *Deleted

## 2022-02-27 ENCOUNTER — Ambulatory Visit: Payer: Medicaid Other | Admitting: *Deleted

## 2022-02-27 ENCOUNTER — Ambulatory Visit: Payer: Medicaid Other | Attending: Obstetrics and Gynecology

## 2022-02-27 ENCOUNTER — Other Ambulatory Visit: Payer: Self-pay | Admitting: *Deleted

## 2022-02-27 VITALS — BP 122/63 | HR 86

## 2022-02-27 DIAGNOSIS — Z3A24 24 weeks gestation of pregnancy: Secondary | ICD-10-CM | POA: Diagnosis not present

## 2022-02-27 DIAGNOSIS — O99212 Obesity complicating pregnancy, second trimester: Secondary | ICD-10-CM | POA: Diagnosis not present

## 2022-02-27 DIAGNOSIS — O43199 Other malformation of placenta, unspecified trimester: Secondary | ICD-10-CM | POA: Insufficient documentation

## 2022-02-27 DIAGNOSIS — E669 Obesity, unspecified: Secondary | ICD-10-CM

## 2022-02-27 DIAGNOSIS — O43192 Other malformation of placenta, second trimester: Secondary | ICD-10-CM | POA: Diagnosis not present

## 2022-02-28 ENCOUNTER — Ambulatory Visit: Payer: Medicaid Other

## 2022-03-12 ENCOUNTER — Other Ambulatory Visit: Payer: Medicaid Other

## 2022-03-12 ENCOUNTER — Encounter: Payer: Medicaid Other | Admitting: Obstetrics

## 2022-03-21 ENCOUNTER — Encounter: Payer: Medicaid Other | Admitting: Obstetrics and Gynecology

## 2022-03-27 ENCOUNTER — Ambulatory Visit: Payer: Medicaid Other | Attending: Maternal & Fetal Medicine

## 2022-03-27 ENCOUNTER — Other Ambulatory Visit: Payer: Self-pay | Admitting: *Deleted

## 2022-03-27 ENCOUNTER — Ambulatory Visit: Payer: Medicaid Other | Admitting: *Deleted

## 2022-03-27 VITALS — BP 130/69 | HR 86

## 2022-03-27 DIAGNOSIS — Z362 Encounter for other antenatal screening follow-up: Secondary | ICD-10-CM | POA: Diagnosis not present

## 2022-03-27 DIAGNOSIS — O43193 Other malformation of placenta, third trimester: Secondary | ICD-10-CM | POA: Diagnosis not present

## 2022-03-27 DIAGNOSIS — E669 Obesity, unspecified: Secondary | ICD-10-CM | POA: Diagnosis not present

## 2022-03-27 DIAGNOSIS — Z3A28 28 weeks gestation of pregnancy: Secondary | ICD-10-CM | POA: Diagnosis not present

## 2022-03-27 DIAGNOSIS — O43199 Other malformation of placenta, unspecified trimester: Secondary | ICD-10-CM | POA: Diagnosis present

## 2022-03-27 DIAGNOSIS — Z3480 Encounter for supervision of other normal pregnancy, unspecified trimester: Secondary | ICD-10-CM | POA: Diagnosis present

## 2022-03-27 DIAGNOSIS — O99213 Obesity complicating pregnancy, third trimester: Secondary | ICD-10-CM | POA: Insufficient documentation

## 2022-03-27 DIAGNOSIS — Z3689 Encounter for other specified antenatal screening: Secondary | ICD-10-CM

## 2022-04-04 ENCOUNTER — Encounter: Payer: Self-pay | Admitting: Obstetrics and Gynecology

## 2022-04-04 ENCOUNTER — Ambulatory Visit (INDEPENDENT_AMBULATORY_CARE_PROVIDER_SITE_OTHER): Payer: Medicaid Other | Admitting: Obstetrics and Gynecology

## 2022-04-04 VITALS — BP 128/78 | HR 90 | Wt 196.5 lb

## 2022-04-04 DIAGNOSIS — Z3A30 30 weeks gestation of pregnancy: Secondary | ICD-10-CM

## 2022-04-04 DIAGNOSIS — B354 Tinea corporis: Secondary | ICD-10-CM | POA: Insufficient documentation

## 2022-04-04 DIAGNOSIS — O43199 Other malformation of placenta, unspecified trimester: Secondary | ICD-10-CM

## 2022-04-04 DIAGNOSIS — Z3480 Encounter for supervision of other normal pregnancy, unspecified trimester: Secondary | ICD-10-CM

## 2022-04-04 MED ORDER — TERBINAFINE HCL 1 % EX CREA: 1.0000 | TOPICAL_CREAM | Freq: Two times a day (BID) | CUTANEOUS | 0 refills | Status: DC

## 2022-04-04 NOTE — Progress Notes (Signed)
Pt presents for ROB visit. Pt c/o of dry rashes over the body. Pt missed GTT. States she can do GTT the first week of July due to work. Pt c/o of lower back and abdominal pain while working and is requesting a maternity band.

## 2022-04-18 ENCOUNTER — Ambulatory Visit (INDEPENDENT_AMBULATORY_CARE_PROVIDER_SITE_OTHER): Payer: Medicaid Other | Admitting: Obstetrics and Gynecology

## 2022-04-18 VITALS — BP 121/80 | HR 87 | Wt 195.0 lb

## 2022-04-18 DIAGNOSIS — Z3A32 32 weeks gestation of pregnancy: Secondary | ICD-10-CM

## 2022-04-18 DIAGNOSIS — O43199 Other malformation of placenta, unspecified trimester: Secondary | ICD-10-CM

## 2022-04-18 DIAGNOSIS — Z3480 Encounter for supervision of other normal pregnancy, unspecified trimester: Secondary | ICD-10-CM

## 2022-04-18 DIAGNOSIS — Z3483 Encounter for supervision of other normal pregnancy, third trimester: Secondary | ICD-10-CM

## 2022-04-18 NOTE — Progress Notes (Signed)
   PRENATAL VISIT NOTE  Subjective:  Misty Curry is a 31 y.o. G3P1011 at [redacted]w[redacted]d being seen today for ongoing prenatal care.  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  Patient doing well with no acute concerns today. She reports no complaints.  Contractions: Not present. Vag. Bleeding: None.  Movement: Present. Denies leaking of fluid.   The following portions of the patient's history were reviewed and updated as appropriate: allergies, current medications, past family history, past medical history, past social history, past surgical history and problem list. Problem list updated.  Objective:   Vitals:   04/18/22 1627  BP: 121/80  Pulse: 87  Weight: 195 lb (88.5 kg)    Fetal Status: Fetal Heart Rate (bpm): 135 Fundal Height: 32 cm Movement: Present     General:  Alert, oriented and cooperative. Patient is in no acute distress.  Skin: Skin is warm and dry. No rash noted.   Cardiovascular: Normal heart rate noted  Respiratory: Normal respiratory effort, no problems with respiration noted  Abdomen: Soft, gravid, appropriate for gestational age.  Pain/Pressure: Absent     Pelvic: Cervical exam deferred        Extremities: Normal range of motion.  Edema: None  Mental Status:  Normal mood and affect. Normal behavior. Normal judgment and thought content.   Assessment and Plan:  Pregnancy: G3P1011 at [redacted]w[redacted]d  1. Supervision of other normal pregnancy, antepartum Continue routine prenatal care Lab only visit in 1 week for 2 hour GTT and third trimester labs  2. [redacted] weeks gestation of pregnancy   3. Marginal insertion of umbilical cord affecting management of  mother Pt has follow up u/s 05/15/22  Preterm labor symptoms and general obstetric precautions including but not limited to vaginal bleeding, contractions, leaking of fluid and fetal movement were reviewed in detail with the patient.  Please refer to After Visit Summary for other counseling recommendations.   Return in about 2 weeks (around 05/02/2022).   Mariel Aloe, MD Faculty Attending [facility]

## 2022-04-18 NOTE — Progress Notes (Signed)
Pt reports fetal movement, denies pain.  

## 2022-04-23 ENCOUNTER — Other Ambulatory Visit: Payer: Medicaid Other

## 2022-04-23 DIAGNOSIS — Z3A32 32 weeks gestation of pregnancy: Secondary | ICD-10-CM | POA: Diagnosis not present

## 2022-04-23 DIAGNOSIS — Z3480 Encounter for supervision of other normal pregnancy, unspecified trimester: Secondary | ICD-10-CM

## 2022-04-24 LAB — RPR: RPR Ser Ql: NONREACTIVE

## 2022-04-24 LAB — GLUCOSE TOLERANCE, 2 HOURS W/ 1HR
Glucose, 1 hour: 118 mg/dL (ref 70–179)
Glucose, 2 hour: 90 mg/dL (ref 70–152)
Glucose, Fasting: 78 mg/dL (ref 70–91)

## 2022-04-24 LAB — CBC
Hematocrit: 29.7 % — ABNORMAL LOW (ref 34.0–46.6)
Hemoglobin: 9.1 g/dL — ABNORMAL LOW (ref 11.1–15.9)
MCH: 23.9 pg — ABNORMAL LOW (ref 26.6–33.0)
MCHC: 30.6 g/dL — ABNORMAL LOW (ref 31.5–35.7)
MCV: 78 fL — ABNORMAL LOW (ref 79–97)
Platelets: 319 10*3/uL (ref 150–450)
RBC: 3.81 x10E6/uL (ref 3.77–5.28)
RDW: 14.6 % (ref 11.7–15.4)
WBC: 8.7 10*3/uL (ref 3.4–10.8)

## 2022-04-24 LAB — HIV ANTIBODY (ROUTINE TESTING W REFLEX): HIV Screen 4th Generation wRfx: NONREACTIVE

## 2022-05-02 ENCOUNTER — Encounter: Payer: Self-pay | Admitting: Obstetrics and Gynecology

## 2022-05-02 ENCOUNTER — Ambulatory Visit (INDEPENDENT_AMBULATORY_CARE_PROVIDER_SITE_OTHER): Payer: Medicaid Other | Admitting: Obstetrics and Gynecology

## 2022-05-02 VITALS — BP 123/83 | HR 85 | Wt 199.0 lb

## 2022-05-02 DIAGNOSIS — O43199 Other malformation of placenta, unspecified trimester: Secondary | ICD-10-CM

## 2022-05-02 DIAGNOSIS — Z3480 Encounter for supervision of other normal pregnancy, unspecified trimester: Secondary | ICD-10-CM

## 2022-05-02 DIAGNOSIS — Z3483 Encounter for supervision of other normal pregnancy, third trimester: Secondary | ICD-10-CM

## 2022-05-02 DIAGNOSIS — Z3A34 34 weeks gestation of pregnancy: Secondary | ICD-10-CM

## 2022-05-02 MED ORDER — FERROUS SULFATE 325 (65 FE) MG PO TABS: 325.0000 mg | ORAL_TABLET | ORAL | 1 refills | Status: AC

## 2022-05-02 NOTE — Progress Notes (Signed)
ROB 34 weeks. No complaints from patient at this time.

## 2022-05-02 NOTE — Progress Notes (Signed)
   PRENATAL VISIT NOTE  Subjective:  Misty Curry is a 31 y.o. G3P1011 at [redacted]w[redacted]d being seen today for ongoing prenatal care.  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  Patient reports no complaints.  Contractions: Irritability. Vag. Bleeding: None.  Movement: Present. Denies leaking of fluid.   The following portions of the patient's history were reviewed and updated as appropriate: allergies, current medications, past family history, past medical history, past social history, past surgical history and problem list.   Objective:   Vitals:   05/02/22 1611  BP: 123/83  Pulse: 85  Weight: 199 lb (90.3 kg)    Fetal Status: Fetal Heart Rate (bpm): 141 Fundal Height: 34 cm Movement: Present     General:  Alert, oriented and cooperative. Patient is in no acute distress.  Skin: Skin is warm and dry. No rash noted.   Cardiovascular: Normal heart rate noted  Respiratory: Normal respiratory effort, no problems with respiration noted  Abdomen: Soft, gravid, appropriate for gestational age.  Pain/Pressure: Absent     Pelvic: Cervical exam deferred        Extremities: Normal range of motion.  Edema: None  Mental Status: Normal mood and affect. Normal behavior. Normal judgment and thought content.   Assessment and Plan:  Pregnancy: G3P1011 at [redacted]w[redacted]d 1. Supervision of other normal pregnancy, antepartum Patient is doing well without complaints Researching pediatrician Patient undecided on contraception but wants birth control Informed patient of anemia and benefits of iron infusion. Patient desires ion pills- rx provided  2. Marginal insertion of umbilical cord  affecting management of mother Follow up growth ultrasound 05/15/22  Preterm labor symptoms and general obstetric precautions including but not limited to vaginal bleeding, contractions, leaking of fluid and fetal movement were reviewed in detail with the patient. Please refer to After Visit Summary for other counseling recommendations.   Return in about 2 weeks (around 05/16/2022) for in person, ROB, Low risk.  Future Appointments  Date Time Provider Department Center  05/15/2022  3:30 PM [facility] NURSE [facility]  05/15/2022  3:45 PM [facility]  05/16/2022  3:30 PM Adam Phenix, MD [facility] None    Catalina Antigua, MD

## 2022-05-15 ENCOUNTER — Ambulatory Visit: Payer: Medicaid Other | Attending: Maternal & Fetal Medicine

## 2022-05-15 ENCOUNTER — Ambulatory Visit: Payer: Medicaid Other | Admitting: *Deleted

## 2022-05-15 VITALS — BP 126/69 | HR 94

## 2022-05-15 DIAGNOSIS — O43193 Other malformation of placenta, third trimester: Secondary | ICD-10-CM | POA: Diagnosis not present

## 2022-05-15 DIAGNOSIS — E669 Obesity, unspecified: Secondary | ICD-10-CM | POA: Diagnosis not present

## 2022-05-15 DIAGNOSIS — Z3689 Encounter for other specified antenatal screening: Secondary | ICD-10-CM | POA: Insufficient documentation

## 2022-05-15 DIAGNOSIS — O43123 Velamentous insertion of umbilical cord, third trimester: Secondary | ICD-10-CM | POA: Diagnosis not present

## 2022-05-15 DIAGNOSIS — O43199 Other malformation of placenta, unspecified trimester: Secondary | ICD-10-CM | POA: Insufficient documentation

## 2022-05-15 DIAGNOSIS — O99213 Obesity complicating pregnancy, third trimester: Secondary | ICD-10-CM | POA: Insufficient documentation

## 2022-05-15 DIAGNOSIS — Z3480 Encounter for supervision of other normal pregnancy, unspecified trimester: Secondary | ICD-10-CM | POA: Insufficient documentation

## 2022-05-15 DIAGNOSIS — Z3A35 35 weeks gestation of pregnancy: Secondary | ICD-10-CM | POA: Insufficient documentation

## 2022-05-16 ENCOUNTER — Encounter: Payer: Self-pay | Admitting: Obstetrics & Gynecology

## 2022-05-16 ENCOUNTER — Other Ambulatory Visit (HOSPITAL_COMMUNITY)
Admission: RE | Admit: 2022-05-16 | Discharge: 2022-05-16 | Disposition: A | Discharge status: Home / Self Care | Payer: Medicaid Other | Source: Ambulatory Visit | Attending: Obstetrics & Gynecology | Admitting: Obstetrics & Gynecology

## 2022-05-16 ENCOUNTER — Ambulatory Visit (INDEPENDENT_AMBULATORY_CARE_PROVIDER_SITE_OTHER): Payer: Medicaid Other | Admitting: Obstetrics & Gynecology

## 2022-05-16 VITALS — BP 122/79 | HR 102 | Wt 204.0 lb

## 2022-05-16 DIAGNOSIS — Z3480 Encounter for supervision of other normal pregnancy, unspecified trimester: Secondary | ICD-10-CM | POA: Insufficient documentation

## 2022-05-16 DIAGNOSIS — Z3483 Encounter for supervision of other normal pregnancy, third trimester: Secondary | ICD-10-CM

## 2022-05-16 DIAGNOSIS — O43199 Other malformation of placenta, unspecified trimester: Secondary | ICD-10-CM

## 2022-05-16 DIAGNOSIS — Z3A36 36 weeks gestation of pregnancy: Secondary | ICD-10-CM

## 2022-05-16 NOTE — Progress Notes (Signed)
   PRENATAL VISIT NOTE  Subjective:  Misty Curry is a 31 y.o. G3P1011 at [redacted]w[redacted]d being seen today for ongoing prenatal care.  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  Patient reports no complaints.  Contractions: Irregular. Vag. Bleeding: None.  Movement: Present. Denies leaking of fluid.   The following portions of the patient's history were reviewed and updated as appropriate: allergies, current medications, past family history, past medical history, past social history, past surgical history and problem list.   Objective:   Vitals:   05/16/22 1521  BP: 122/79  Pulse: (!) 102  Weight: 204 lb (92.5 kg)    Fetal Status: Fetal Heart Rate (bpm): 140   Movement: Present  Presentation: Vertex  General:  Alert, oriented and cooperative. Patient is in no acute distress.  Skin: Skin is warm and dry. No rash noted.   Cardiovascular: Normal heart rate noted  Respiratory: Normal respiratory effort, no problems with respiration noted  Abdomen: Soft, gravid, appropriate for gestational age.  Pain/Pressure: Present     Pelvic: Dilation: 2 Effacement (%): 40 Station: -3  Extremities: Normal range of motion.     Mental Status: Normal mood and affect. Normal behavior. Normal judgment and thought content.   Assessment and Plan:  Pregnancy: G3P1011 at [redacted]w[redacted]d 1. Supervision of other normal pregnancy, antepartum Routine testing - Culture, beta strep (group b only) - Cervicovaginal ancillary only( Frederica)  2. Marginal insertion of umbilical cord affecting management of mother Normal growth  Preterm labor symptoms and general obstetric precautions including but  not limited to vaginal bleeding, contractions, leaking of fluid and fetal movement were reviewed in detail with the patient. Please refer to After Visit Summary for other counseling recommendations.   No follow-ups on file.  No future appointments.  Scheryl Darter, MD

## 2022-05-17 LAB — CERVICOVAGINAL ANCILLARY ONLY
Chlamydia: NEGATIVE
Comment: NEGATIVE
Comment: NORMAL
Neisseria Gonorrhea: NEGATIVE

## 2022-05-21 LAB — CULTURE, BETA STREP (GROUP B ONLY): Strep Gp B Culture: NEGATIVE

## 2022-05-22 ENCOUNTER — Other Ambulatory Visit: Payer: Self-pay

## 2022-05-22 ENCOUNTER — Inpatient Hospital Stay (HOSPITAL_COMMUNITY)
Admission: AD | Admit: 2022-05-22 | Discharge: 2022-05-22 | Disposition: A | Discharge status: Home / Self Care | Payer: Medicaid Other | Attending: Obstetrics & Gynecology | Admitting: Obstetrics & Gynecology

## 2022-05-22 ENCOUNTER — Encounter (HOSPITAL_COMMUNITY): Payer: Self-pay | Admitting: Obstetrics & Gynecology

## 2022-05-22 DIAGNOSIS — Z3480 Encounter for supervision of other normal pregnancy, unspecified trimester: Secondary | ICD-10-CM

## 2022-05-22 DIAGNOSIS — Z3A36 36 weeks gestation of pregnancy: Secondary | ICD-10-CM | POA: Diagnosis not present

## 2022-05-22 DIAGNOSIS — O479 False labor, unspecified: Secondary | ICD-10-CM

## 2022-05-22 DIAGNOSIS — O4703 False labor before 37 completed weeks of gestation, third trimester: Secondary | ICD-10-CM | POA: Insufficient documentation

## 2022-05-22 NOTE — MAU Provider Note (Signed)
None    S: Ms. LILLEY HUBBLE is a 31 y.o. G3P1011 at [redacted]w[redacted]d  who presents to MAU today complaining contractions q 5 minutes for the past three hours. She denies vaginal bleeding. She denies LOF. She reports normal fetal movement.    O: BP 118/71   Pulse 76   Temp 99.1 F (37.3 C) (Oral)   Resp 18   Ht 5\' 5"  (1.651 m)   Wt 92.2 kg   LMP 08/28/2021   SpO2 100%   BMI 33.81 kg/m  GENERAL: Well-developed, well-nourished female in no acute distress.  HEAD: Normocephalic, atraumatic.  CHEST: Normal effort of breathing, regular heart rate ABDOMEN: Soft, nontender, gravid  Cervical exam:  Dilation: 2 Effacement (%): 50 Cervical Position: Posterior Station: -3 Presentation: Vertex Exam by:: [email or web address], RN   Fetal Monitoring: Baseline: 130 Variability: Mod Accelerations: 15 x 15 Decelerations: Variable x 2 Contractions: Irregular, up to six min between contractions   A: SIUP at [redacted]w[redacted]d  Variable x 1 with subsequent Cat I tracing,order placed for discharge by CNM RN restarted monitoring, second variable documented, discharge discontinued Subsequent Cat I for 30 min CNM at bedside to discuss tracing, pain medication declined by patient  P: Discharge home in stable condition  [redacted]w[redacted]d, Calvert Cantor 05/22/2022 8:22 PM

## 2022-05-22 NOTE — MAU Note (Addendum)
Pt presents to MAU with CTX 5 min a part, with pressure in the vagina occurring in the past 3 hours. Pt endorses fetal movement. No vaginal bleeding or LOF noted.

## 2022-05-23 ENCOUNTER — Encounter: Payer: Self-pay | Admitting: Medical

## 2022-05-23 ENCOUNTER — Encounter: Payer: Self-pay | Admitting: Obstetrics & Gynecology

## 2022-05-23 ENCOUNTER — Other Ambulatory Visit: Payer: Self-pay

## 2022-05-23 ENCOUNTER — Ambulatory Visit (INDEPENDENT_AMBULATORY_CARE_PROVIDER_SITE_OTHER): Payer: Medicaid Other | Admitting: Medical

## 2022-05-23 VITALS — BP 124/76 | HR 85 | Wt 203.8 lb

## 2022-05-23 DIAGNOSIS — Z3480 Encounter for supervision of other normal pregnancy, unspecified trimester: Secondary | ICD-10-CM

## 2022-05-23 DIAGNOSIS — O43199 Other malformation of placenta, unspecified trimester: Secondary | ICD-10-CM

## 2022-05-23 DIAGNOSIS — Z3A37 37 weeks gestation of pregnancy: Secondary | ICD-10-CM

## 2022-05-23 DIAGNOSIS — O99210 Obesity complicating pregnancy, unspecified trimester: Secondary | ICD-10-CM

## 2022-05-23 NOTE — Progress Notes (Signed)
Pt presents for ROB visit. Pt went to MAU yesterday c/o contractions q5 mins. Pt states she has still been having contractions but the are mild with no pattern. No other concerns at this time.

## 2022-05-23 NOTE — Progress Notes (Signed)
Breast pump rx faxed to [facility] 

## 2022-05-23 NOTE — Progress Notes (Signed)
   PRENATAL VISIT NOTE  Subjective:  Misty Curry is a 31 y.o. G3P1011 at [redacted]w[redacted]d being seen today for ongoing prenatal care.  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  Patient reports occasional contractions.  Contractions: Irregular. Vag. Bleeding: None.  Movement: Present. Denies leaking of fluid.   The following portions of the patient's history were reviewed and updated as appropriate: allergies, current medications, past family history, past medical history, past social history, past surgical history and problem list.   Objective:   Vitals:   05/23/22 1104  BP: 124/76  Pulse: 85  Weight: 203 lb 12.8 oz (92.4 kg)    Fetal Status: Fetal Heart Rate (bpm): 134 Fundal Height: 37 cm Movement: Present     General:  Alert, oriented and cooperative. Patient is in no acute distress.  Skin: Skin is warm and dry. No rash noted.   Cardiovascular: Normal heart rate noted  Respiratory: Normal respiratory effort, no problems with respiration noted  Abdomen: Soft, gravid, appropriate for gestational age.  Pain/Pressure: Present     Pelvic: Cervical exam deferred        Extremities: Normal range of motion.  Edema: None  Mental Status: Normal mood and affect. Normal behavior. Normal judgment and thought content.   Assessment and Plan:  Pregnancy: G3P1011 at [redacted]w[redacted]d 1. Supervision of other normal pregnancy, antepartum - GBS, GC/CT negative at last visit discussed   2. Obesity in pregnancy  3. Marginal insertion of umbilical cord affecting management of mother - Last [place] 8/2 EFW 61%  4. [redacted] weeks gestation of pregnancy  Term labor symptoms and general obstetric  precautions including but not limited to vaginal bleeding, contractions, leaking of fluid and fetal movement were reviewed in detail with the patient. Please refer to After Visit Summary for other counseling recommendations.   Return in about 1 week (around 05/30/2022) for LOB, Virtual.  Future Appointments  Date Time Provider Department Center  05/30/2022 10:35 AM Warden Fillers, MD [facility] None  06/05/2022  9:55 AM Hermina Staggers, MD [facility] None  06/13/2022 10:35 AM Hermina Staggers, MD [facility] None    Vonzella Nipple, PA-C

## 2022-05-24 DIAGNOSIS — Z3483 Encounter for supervision of other normal pregnancy, third trimester: Secondary | ICD-10-CM | POA: Diagnosis not present

## 2022-05-24 DIAGNOSIS — Z3482 Encounter for supervision of other normal pregnancy, second trimester: Secondary | ICD-10-CM | POA: Diagnosis not present

## 2022-05-30 ENCOUNTER — Encounter: Payer: Medicaid Other | Admitting: Obstetrics and Gynecology

## 2022-06-01 ENCOUNTER — Inpatient Hospital Stay (HOSPITAL_COMMUNITY)
Admission: AD | Admit: 2022-06-01 | Discharge: 2022-06-03 | DRG: 807 | Disposition: A | Discharge status: Home / Self Care | Payer: Medicaid Other | Attending: Obstetrics and Gynecology | Admitting: Obstetrics and Gynecology

## 2022-06-01 DIAGNOSIS — J45909 Unspecified asthma, uncomplicated: Secondary | ICD-10-CM | POA: Diagnosis present

## 2022-06-01 DIAGNOSIS — O9952 Diseases of the respiratory system complicating childbirth: Secondary | ICD-10-CM | POA: Diagnosis present

## 2022-06-01 DIAGNOSIS — O43123 Velamentous insertion of umbilical cord, third trimester: Secondary | ICD-10-CM | POA: Diagnosis present

## 2022-06-01 DIAGNOSIS — O43199 Other malformation of placenta, unspecified trimester: Secondary | ICD-10-CM | POA: Diagnosis present

## 2022-06-01 DIAGNOSIS — Z3A38 38 weeks gestation of pregnancy: Secondary | ICD-10-CM

## 2022-06-01 DIAGNOSIS — O0990 Supervision of high risk pregnancy, unspecified, unspecified trimester: Secondary | ICD-10-CM

## 2022-06-01 NOTE — MAU Note (Signed)
.  Misty Curry is a 31 y.o. at [redacted]w[redacted]d here in MAU reporting: ctx that started 3 hours ago and a small amount of bloody show. She is unsure if her water has broken around 2200 she noticed some LOF. Reports good FM.   Onset of complaint: today Pain score: 8/10  FHT:164 Lab orders placed from triage:  MAU labor

## 2022-06-01 NOTE — MAU Note (Incomplete)
.  Misty Curry is a 31 y.o. at [redacted]w[redacted]d here in MAU reporting: ctx that started 3 hours ago and a small amount of bloody show. She is unsure if her water has broken around 2200 she noticed some LOF. Reports good FM.   Onset of complaint: today Pain score: 8/10 There were no vitals filed for this visit.   FHT:164 Lab orders placed from triage:  MAU labor

## 2022-06-02 ENCOUNTER — Encounter (HOSPITAL_COMMUNITY): Payer: Self-pay | Admitting: Obstetrics and Gynecology

## 2022-06-02 ENCOUNTER — Inpatient Hospital Stay (HOSPITAL_COMMUNITY): Payer: Medicaid Other | Admitting: Anesthesiology

## 2022-06-02 DIAGNOSIS — Z3A38 38 weeks gestation of pregnancy: Secondary | ICD-10-CM | POA: Diagnosis not present

## 2022-06-02 DIAGNOSIS — J45909 Unspecified asthma, uncomplicated: Secondary | ICD-10-CM | POA: Diagnosis not present

## 2022-06-02 DIAGNOSIS — O43123 Velamentous insertion of umbilical cord, third trimester: Secondary | ICD-10-CM | POA: Diagnosis not present

## 2022-06-02 DIAGNOSIS — O9952 Diseases of the respiratory system complicating childbirth: Secondary | ICD-10-CM | POA: Diagnosis not present

## 2022-06-02 DIAGNOSIS — O26893 Other specified pregnancy related conditions, third trimester: Secondary | ICD-10-CM | POA: Diagnosis not present

## 2022-06-02 DIAGNOSIS — O9902 Anemia complicating childbirth: Secondary | ICD-10-CM | POA: Diagnosis not present

## 2022-06-02 DIAGNOSIS — D649 Anemia, unspecified: Secondary | ICD-10-CM | POA: Diagnosis not present

## 2022-06-02 DIAGNOSIS — O4423 Partial placenta previa NOS or without hemorrhage, third trimester: Secondary | ICD-10-CM | POA: Diagnosis not present

## 2022-06-02 LAB — CBC
HCT: 30.7 % — ABNORMAL LOW (ref 36.0–46.0)
Hemoglobin: 9.8 g/dL — ABNORMAL LOW (ref 12.0–15.0)
MCH: 23.7 pg — ABNORMAL LOW (ref 26.0–34.0)
MCHC: 31.9 g/dL (ref 30.0–36.0)
MCV: 74.3 fL — ABNORMAL LOW (ref 80.0–100.0)
Platelets: 284 10*3/uL (ref 150–400)
RBC: 4.13 MIL/uL (ref 3.87–5.11)
RDW: 15.9 % — ABNORMAL HIGH (ref 11.5–15.5)
WBC: 6.8 10*3/uL (ref 4.0–10.5)
nRBC: 0 % (ref 0.0–0.2)

## 2022-06-02 LAB — POCT FERN TEST: POCT Fern Test: NEGATIVE

## 2022-06-02 LAB — TYPE AND SCREEN
ABO/RH(D): B POS
Antibody Screen: NEGATIVE

## 2022-06-02 LAB — RPR: RPR Ser Ql: NONREACTIVE

## 2022-06-02 MED ORDER — ONDANSETRON HCL 4 MG/2ML IJ SOLN: 4.0000 mg | Freq: Four times a day (QID) | INTRAMUSCULAR | Status: DC | PRN

## 2022-06-02 MED ORDER — LIDOCAINE HCL (PF) 1 % IJ SOLN: 30.0000 mL | INTRAMUSCULAR | Status: DC | PRN

## 2022-06-02 MED ORDER — BENZOCAINE-MENTHOL 20-0.5 % EX AERO
1.0000 | INHALATION_SPRAY | CUTANEOUS | Status: DC | PRN
  Administered 2022-06-02: 1 via TOPICAL
  Filled 2022-06-02: qty 56

## 2022-06-02 MED ORDER — WITCH HAZEL-GLYCERIN EX PADS: 1.0000 | MEDICATED_PAD | CUTANEOUS | Status: DC | PRN

## 2022-06-02 MED ORDER — ONDANSETRON HCL 4 MG PO TABS: 4.0000 mg | ORAL_TABLET | ORAL | Status: DC | PRN

## 2022-06-02 MED ORDER — LACTATED RINGERS IV SOLN
500.0000 mL | Freq: Once | INTRAVENOUS | Status: AC
  Administered 2022-06-02: 500 mL via INTRAVENOUS

## 2022-06-02 MED ORDER — LACTATED RINGERS IV SOLN: INTRAVENOUS | Status: DC

## 2022-06-02 MED ORDER — LIDOCAINE-EPINEPHRINE (PF) 2 %-1:200000 IJ SOLN
INTRAMUSCULAR | Status: DC | PRN
  Administered 2022-06-02: 5 mL via EPIDURAL

## 2022-06-02 MED ORDER — SOD CITRATE-CITRIC ACID 500-334 MG/5ML PO SOLN: 30.0000 mL | ORAL | Status: DC | PRN

## 2022-06-02 MED ORDER — DIBUCAINE (PERIANAL) 1 % EX OINT: 1.0000 | TOPICAL_OINTMENT | CUTANEOUS | Status: DC | PRN

## 2022-06-02 MED ORDER — EPHEDRINE 5 MG/ML INJ: 10.0000 mg | INTRAVENOUS | Status: DC | PRN

## 2022-06-02 MED ORDER — ACETAMINOPHEN 325 MG PO TABS: 650.0000 mg | ORAL_TABLET | ORAL | Status: DC | PRN

## 2022-06-02 MED ORDER — OXYCODONE-ACETAMINOPHEN 5-325 MG PO TABS: 2.0000 | ORAL_TABLET | ORAL | Status: DC | PRN

## 2022-06-02 MED ORDER — OXYTOCIN BOLUS FROM INFUSION
333.0000 mL | Freq: Once | INTRAVENOUS | Status: AC
  Administered 2022-06-02: 333 mL via INTRAVENOUS

## 2022-06-02 MED ORDER — PHENYLEPHRINE 80 MCG/ML (10ML) SYRINGE FOR IV PUSH (FOR BLOOD PRESSURE SUPPORT): 80.0000 ug | PREFILLED_SYRINGE | INTRAVENOUS | Status: DC | PRN

## 2022-06-02 MED ORDER — COCONUT OIL OIL: 1.0000 | TOPICAL_OIL | Status: DC | PRN

## 2022-06-02 MED ORDER — ONDANSETRON HCL 4 MG/2ML IJ SOLN: 4.0000 mg | INTRAMUSCULAR | Status: DC | PRN

## 2022-06-02 MED ORDER — FENTANYL-BUPIVACAINE-NACL 0.5-0.125-0.9 MG/250ML-% EP SOLN
12.0000 mL/h | EPIDURAL | Status: DC | PRN
  Administered 2022-06-02: 12 mL/h via EPIDURAL
  Filled 2022-06-02: qty 250

## 2022-06-02 MED ORDER — OXYCODONE-ACETAMINOPHEN 5-325 MG PO TABS: 1.0000 | ORAL_TABLET | ORAL | Status: DC | PRN

## 2022-06-02 MED ORDER — PRENATAL MULTIVITAMIN CH
1.0000 | ORAL_TABLET | Freq: Every day | ORAL | Status: DC
  Administered 2022-06-02 – 2022-06-03 (×2): 1 via ORAL
  Filled 2022-06-02 (×2): qty 1

## 2022-06-02 MED ORDER — DIPHENHYDRAMINE HCL 25 MG PO CAPS: 25.0000 mg | ORAL_CAPSULE | Freq: Four times a day (QID) | ORAL | Status: DC | PRN

## 2022-06-02 MED ORDER — IBUPROFEN 600 MG PO TABS
600.0000 mg | ORAL_TABLET | Freq: Four times a day (QID) | ORAL | Status: DC
  Administered 2022-06-02 – 2022-06-03 (×6): 600 mg via ORAL
  Filled 2022-06-02 (×6): qty 1

## 2022-06-02 MED ORDER — SENNOSIDES-DOCUSATE SODIUM 8.6-50 MG PO TABS
2.0000 | ORAL_TABLET | Freq: Every day | ORAL | Status: DC
  Administered 2022-06-03: 2 via ORAL
  Filled 2022-06-02: qty 2

## 2022-06-02 MED ORDER — DIPHENHYDRAMINE HCL 50 MG/ML IJ SOLN: 12.5000 mg | INTRAMUSCULAR | Status: DC | PRN

## 2022-06-02 MED ORDER — LACTATED RINGERS IV SOLN: 500.0000 mL | INTRAVENOUS | Status: DC | PRN

## 2022-06-02 MED ORDER — SIMETHICONE 80 MG PO CHEW
80.0000 mg | CHEWABLE_TABLET | ORAL | Status: DC | PRN
  Administered 2022-06-02: 80 mg via ORAL
  Filled 2022-06-02: qty 1

## 2022-06-02 MED ORDER — TETANUS-DIPHTH-ACELL PERTUSSIS 5-2.5-18.5 LF-MCG/0.5 IM SUSY: 0.5000 mL | PREFILLED_SYRINGE | Freq: Once | INTRAMUSCULAR | Status: DC

## 2022-06-02 MED ORDER — LEVONORGESTREL 20 MCG/DAY IU IUD
1.0000 | INTRAUTERINE_SYSTEM | Freq: Once | INTRAUTERINE | Status: AC
  Administered 2022-06-02: 1 via INTRAUTERINE
  Filled 2022-06-02: qty 1

## 2022-06-02 MED ORDER — OXYTOCIN-SODIUM CHLORIDE 30-0.9 UT/500ML-% IV SOLN
2.5000 [IU]/h | INTRAVENOUS | Status: DC
  Administered 2022-06-02: 2.5 [IU]/h via INTRAVENOUS
  Filled 2022-06-02: qty 500

## 2022-06-02 MED ORDER — ZOLPIDEM TARTRATE 5 MG PO TABS: 5.0000 mg | ORAL_TABLET | Freq: Every evening | ORAL | Status: DC | PRN

## 2022-06-02 NOTE — Anesthesia Postprocedure Evaluation (Signed)
Anesthesia Post Note  Patient: Misty Curry  Procedure(s) Performed: AN AD HOC LABOR EPIDURAL     Patient location during evaluation: Mother Baby Anesthesia Type: Epidural Level of consciousness: awake and alert Pain management: pain level controlled Vital Signs Assessment: post-procedure vital signs reviewed and stable Respiratory status: spontaneous breathing, nonlabored ventilation and respiratory function stable Cardiovascular status: stable Postop Assessment: no headache, no backache and epidural receding Anesthetic complications: no   No notable events documented.  Last Vitals:  Vitals:   06/02/22 0442 06/02/22 0523  BP: 122/86 122/78  Pulse: 83 72  Resp:  18  Temp: 36.7 C 37.2 C  SpO2: 100% 100%    Last Pain:  Vitals:   06/02/22 0523  TempSrc: Oral  PainSc:    Pain Goal:                   Didier Brandenburg

## 2022-06-02 NOTE — Anesthesia Preprocedure Evaluation (Signed)
Anesthesia Evaluation  Patient identified by MRN, date of birth, ID band Patient awake    Reviewed: Allergy & Precautions, NPO status , Patient's Chart, lab work & pertinent test results  Airway Mallampati: II  TM Distance: >3 FB Neck ROM: Full    Dental no notable dental hx.    Pulmonary asthma ,    Pulmonary exam normal breath sounds clear to auscultation       Cardiovascular negative cardio ROS Normal cardiovascular exam Rhythm:Regular Rate:Normal     Neuro/Psych PSYCHIATRIC DISORDERS Anxiety negative neurological ROS     GI/Hepatic negative GI ROS, Neg liver ROS,   Endo/Other  negative endocrine ROS  Renal/GU negative Renal ROS  negative genitourinary   Musculoskeletal negative musculoskeletal ROS (+)   Abdominal   Peds  Hematology  (+) Blood dyscrasia, anemia ,   Anesthesia Other Findings   Reproductive/Obstetrics (+) Pregnancy                             Anesthesia Physical Anesthesia Plan  ASA: 2  Anesthesia Plan: Epidural   Post-op Pain Management:    Induction:   PONV Risk Score and Plan: Treatment may vary due to age or medical condition  Airway Management Planned: Natural Airway  Additional Equipment:   Intra-op Plan:   Post-operative Plan:   Informed Consent: I have reviewed the patients History and Physical, chart, labs and discussed the procedure including the risks, benefits and alternatives for the proposed anesthesia with the patient or authorized representative who has indicated his/her understanding and acceptance.       Plan Discussed with: Anesthesiologist  Anesthesia Plan Comments: (Patient identified. Risks, benefits, options discussed with patient including but not limited to bleeding, infection, nerve damage, paralysis, failed block, incomplete pain control, headache, blood pressure changes, nausea, vomiting, reactions to medication, itching,  and post partum back pain. Confirmed with bedside nurse the patient's most recent platelet count. Confirmed with the patient that they are not taking any anticoagulation, have any bleeding history or any family history of bleeding disorders. Patient expressed understanding and wishes to proceed. All questions were answered. )        Anesthesia Quick Evaluation

## 2022-06-02 NOTE — Lactation Note (Signed)
This note was copied from a baby's chart. Lactation Consultation Note  Patient Name: Misty Curry Today's Date: 06/02/2022 Reason for consult: L&D Initial assessment;Early term 37-38.6wks Age:30 hours  Initial L&D Consult:  Visited with family < 1 hour after birth Birth parent holding infant; she has already latched/fed without difficulty.  Reassured parents that lactation services will be available on the M/B unit.  Allowed time for family bonding.   Maternal Data    Feeding Mother's Current Feeding Choice: Breast Milk  LATCH Score                    Lactation Tools Discussed/Used    Interventions Interventions: Skin to skin;Assisted with latch  Discharge    Consult Status Consult Status: Follow-up from L&D    Misty Curry R [name] 06/02/2022, 3:31 AM

## 2022-06-02 NOTE — H&P (Signed)
Misty Curry is a 31 y.o. female presenting for spontaneous onset of labor at [redacted]w[redacted]d dates confirmed by Ultrasound at [redacted]w[redacted]d.  OB History     Gravida  3   Para  1   Term  1   Preterm      AB  1   Living  1      SAB  1   IAB      Ectopic      Multiple      Live Births  1          Past Medical History:  Diagnosis Date   Anxiety    Asthma    Vaginal Pap smear, abnormal    Past Surgical History:  Procedure Laterality Date   HERNIA REPAIR     MOLE REMOVAL     Family History: family history includes Healthy in her father and mother. Social History:  reports that she has never smoked. She has never used smokeless tobacco. She reports that she does not drink alcohol and does not use drugs.     Maternal Diabetes: No Genetic Screening: Normal Maternal Ultrasounds/Referrals: Other: Fetal Ultrasounds or other Referrals:  Other: Marginal Cord Insertion  Maternal Substance Abuse:  No Significant Maternal Medications:  None Significant Maternal Lab Results:  Group B Strep negative Number of Prenatal Visits:greater than 3 verified prenatal visits Other Comments:     Last Ultrasound on 05/15/22 Revealed: Normal AFI and Marginal cord. No fetal abnormalities. EFW 6lbs 6oz 61%tile.   Declined Flu and T-dap   Review of Systems  Constitutional:  Negative for chills, fatigue and fever.  Eyes:  Negative for pain and visual disturbance.  Respiratory:  Negative for apnea, shortness of breath and wheezing.   Cardiovascular:  Negative for chest pain and palpitations.  Gastrointestinal:  Positive for abdominal pain. Negative for constipation, diarrhea, nausea and vomiting.  Genitourinary:  Positive for pelvic pain. Negative for difficulty urinating and dysuria.  Musculoskeletal:  Negative for back pain.  Neurological:  Negative for seizures, weakness and headaches.  Psychiatric/Behavioral:  Negative for suicidal ideas.    Maternal Medical History:  Reason for admission:  Nausea.    Dilation: 6 Effacement (%): 70 Station: -2 Exam by:: Felipa Furnace, RN Last menstrual period 08/28/2021. Maternal Exam:  Cervix: Cervix evaluated by digital exam.     Physical Exam Vitals and nursing note reviewed.  Constitutional:      General: She is not in acute distress.    Appearance: Normal appearance.  HENT:     Head: Normocephalic.  Pulmonary:     Effort: Pulmonary effort is normal.  Musculoskeletal:     Cervical back: Normal range of motion.  Skin:    General: Skin is warm and dry.  Neurological:     Mental Status: She is alert and oriented to person, place, and time.  Psychiatric:        Mood and Affect: Mood normal.     Prenatal labs: ABO, Rh: B/Positive/-- (02/27 0942) Antibody: Negative (02/27 0942) Rubella: 1.48 (02/27 0942) RPR: Non Reactive (07/11 1134)  HBsAg: Negative (02/27 0942)  HIV: Non Reactive (07/11 1134)  GBS: Negative/-- (08/03 1616)   Assessment/Plan: - Admit to L&D for SOL - Expectant management  - FHT Cat I  - GBS Negative  - NSVD   Report given to  C. Pickens MD.    Claudette Head, MSN CNM  06/02/2022, 12:10 AM

## 2022-06-02 NOTE — Plan of Care (Signed)

## 2022-06-02 NOTE — Lactation Note (Signed)
This note was copied from a baby's chart. Lactation Consultation Note  Patient Name: Misty Curry HYIFO'Y Date: 06/02/2022 Reason for consult: Initial assessment;Early term 37-38.6wks (per Birth parent baby ate last at 6:50 am . baby presently sleeping. LC encouraged Birth parent to call with feeding cues.) Age:29 hours  Maternal Data    Feeding Mother's Current Feeding Choice: Breast Milk   Discharge Pump: DEBP;Personal  Consult Status Consult Status: Follow-up Date: 06/02/22 Follow-up type: In-patient    Matilde Sprang Farron Lafond 06/02/2022, 8:53 AM

## 2022-06-02 NOTE — Anesthesia Procedure Notes (Signed)
Epidural Patient location during procedure: OB Start time: 06/02/2022 1:00 AM End time: 06/02/2022 1:10 AM  Staffing Anesthesiologist: Elmer Picker, MD Performed: anesthesiologist   Preanesthetic Checklist Completed: patient identified, IV checked, risks and benefits discussed, monitors and equipment checked, pre-op evaluation and timeout performed  Epidural Patient position: sitting Prep: DuraPrep and site prepped and draped Patient monitoring: continuous pulse ox, blood pressure, heart rate and cardiac monitor Approach: midline Location: L3-L4 Injection technique: LOR air  Needle:  Needle type: Tuohy  Needle gauge: 17 G Needle length: 9 cm Needle insertion depth: 6 cm Catheter type: closed end flexible Catheter size: 19 Gauge Catheter at skin depth: 11 cm Test dose: negative  Assessment Sensory level: T8 Events: blood not aspirated, injection not painful, no injection resistance, no paresthesia and negative IV test  Additional Notes Patient identified. Risks/Benefits/Options discussed with patient including but not limited to bleeding, infection, nerve damage, paralysis, failed block, incomplete pain control, headache, blood pressure changes, nausea, vomiting, reactions to medication both or allergic, itching and postpartum back pain. Confirmed with bedside nurse the patient's most recent platelet count. Confirmed with patient that they are not currently taking any anticoagulation, have any bleeding history or any family history of bleeding disorders. Patient expressed understanding and wished to proceed. All questions were answered. Sterile technique was used throughout the entire procedure. Please see nursing notes for vital signs. Test dose was given through epidural catheter and negative prior to continuing to dose epidural or start infusion. Warning signs of high block given to the patient including shortness of breath, tingling/numbness in hands, complete motor block,  or any concerning symptoms with instructions to call for help. Patient was given instructions on fall risk and not to get out of bed. All questions and concerns addressed with instructions to call with any issues or inadequate analgesia.  Reason for block:procedure for pain

## 2022-06-02 NOTE — Discharge Summary (Signed)
Postpartum Discharge Summary  Date of Service updated***     Patient Name: Misty Curry DOB: 1991/05/25 MRN: [id number]  Date of admission: 06/01/2022 Delivery date:06/02/2022  Delivering provider: Concepcion Living  Date of discharge: 06/02/2022  Admitting diagnosis: Normal labor [O80, Z37.9] Intrauterine pregnancy: [redacted]w[redacted]d    Secondary diagnosis:  Principal Problem:   Normal labor Active Problems:   Supervision of high risk pregnancy, antepartum   Marginal insertion of umbilical cord affecting management of mother  Additional problems: ***    Discharge diagnosis: Term Pregnancy Delivered                                              Post partum procedures:{Postpartum procedures:23558} Augmentation: AROM Complications: {OB Labor/Delivery Complications:20784}  Hospital course: {Courses:23701}  Magnesium Sulfate received: {Mag received:30440022} BMZ received: {BMZ received:30440023} Rhophylac:{Rhophylac received:30440032} MMR:{MMR:30440033} T-DaP:{Tdap:23962} Flu: {{GUY:40347}Transfusion:{Transfusion received:30440034}  Physical exam  Vitals:   06/02/22 0205 06/02/22 0230 06/02/22 0305 06/02/22 0315  BP: 124/76 117/63 101/77 (!) 141/67  Pulse: 84 100 93 95  Resp:      Temp:      TempSrc:      SpO2:       General: {Exam; general:21111117} Lochia: {Desc; appropriate/inappropriate:30686::"appropriate"} Uterine Fundus: {Desc; firm/soft:30687} Incision: {Exam; incision:21111123} DVT Evaluation: {Exam; dvt:2111122} Labs: Lab Results  Component Value Date   WBC 6.8 06/02/2022   HGB 9.8 (L) 06/02/2022   HCT 30.7 (L) 06/02/2022   MCV 74.3 (L) 06/02/2022   PLT 284 06/02/2022      Latest Ref Rng & Units 12/10/2021    9:42 AM  CMP  Glucose 70 - 99 mg/dL 82   BUN 6 - 20 mg/dL 4   Creatinine 0.57 - 1.00 mg/dL 0.76   Sodium 134 - 144 mmol/L 134   Potassium 3.5 - 5.2 mmol/L 3.9   Chloride 96 - 106 mmol/L 99   CO2 20 - 29 mmol/L 14   Calcium 8.7 - 10.2  mg/dL 9.4   Total Protein 6.0 - 8.5 g/dL 7.8   Total Bilirubin 0.0 - 1.2 mg/dL <0.2   Alkaline Phos 44 - 121 IU/L 48   AST 0 - 40 IU/L 16   ALT 0 - 32 IU/L 7    Edinburgh Score:     No data to display           After visit meds:  Allergies as of 06/02/2022   No Known Allergies   [facility]        Discharge home in stable condition Infant Feeding: {Baby feeding:23562} Infant Disposition:{CHL IP OB HOME WITH MQQVZDG:38756}Discharge instruction: per After Visit Summary and Postpartum booklet. Activity: Advance as tolerated. Pelvic rest for 6 weeks.  Diet: {OB dEPPI:95188416}Future Appointments: Future Appointments  Date Time Provider DGlenmora 06/05/2022  9:55 AM EChancy Milroy MD [facility]  06/13/2022 10:35 AM EChancy Milroy MD [facility]   Follow up Visit:  Message sent 06/02/22  Please schedule this patient for a In person postpartum visit in 6 weeks with the following provider: Any provider. Additional Postpartum F/U: None    Low risk pregnancy complicated by: none Delivery mode:  Vaginal, Spontaneous  Anticipated Birth Control:  PP IUD placed   06/02/2022 JConcepcion Living MD

## 2022-06-02 NOTE — Lactation Note (Signed)
This note was copied from a baby's chart. Lactation Consultation Note  Patient Name: Misty Curry [id number] Date: 06/02/2022 Reason for consult: Follow-up assessment;Mother's request;Early term 37-38.6wks;Breastfeeding assistance Age:30 hours  P2, Early Term, Infant Female  LC entered the room and baby was asleep in the bassinet. Per the birth parent, baby is doing well at the breast. She stated that baby last fed a few hours ago and would like to try to latch baby "PJ".   LC removed the blankets from baby and changed a wet and soiled diaper.   LC gave baby to the birth parent and he had an episode of emesis and a stool.   The birth parent changed baby's diaper.   LC assisted the birth parent with putting baby to the left breast in the football position.   The birth parent stated that the latch was comfortable.   Baby was latched deeply, with flanged lips, baby's sucking was rhythmic when stimulated, and some swallows were noted.   Baby was still feeding when LC left the room.   The birth parent states that she has no further questions or concerns.   LATCH Score Latch: Grasps breast easily, tongue down, lips flanged, rhythmical sucking.  Audible Swallowing: A few with stimulation  Type of Nipple: Everted at rest and after stimulation  Comfort (Breast/Nipple): Soft / non-tender  Hold (Positioning): No assistance needed to correctly position infant at breast.  LATCH Score: 9   Lactation Tools Discussed/Used    Interventions Interventions: Assisted with latch;Support pillows;Adjust position;Education  Discharge    Consult Status Consult Status: Follow-up Date: 06/03/22 Follow-up type: In-patient    Orvil Feil Shayn Madole 06/02/2022, 2:45 PM

## 2022-06-03 NOTE — Progress Notes (Signed)
Circumcision Consent  Discussed with mom at bedside about circumcision.   Circumcision is a surgery that removes the skin that covers the tip of the penis, called the "foreskin." Circumcision is usually done when a boy is between 42 and 39 days old, sometimes up to 2-52 weeks old.  The most common reasons boys are circumcised include for cultural/religious beliefs or for parental preference (potentially easier to clean, so baby looks like daddy, etc).  There may be some medical benefits for circumcision:   Circumcised boys seem to have slightly lower rates of: ? Urinary tract infections (per the American Academy of Pediatrics an uncircumcised boy has a 1/100 chance of developing a UTI in the first year of life, a circumcised boy at a 10/998 chance of developing a UTI in the first year of life- a 10% reduction) ? Penis cancer (typically rare- an uncircumcised female has a 1 in 100,000 chance of developing cancer of the penis) ? Sexually transmitted infection (in endemic areas, including HIV, HPV and Herpes- circumcision does NOT protect against gonorrhea, chlamydia, trachomatis, or syphilis) ? Phimosis: a condition where that makes retraction of the foreskin over the glans impossible (0.4 per 1000 boys per year or 0.6% of boys are affected by their 15th birthday)  Boys and men who are not circumcised can reduce these extra risks by: ? Cleaning their penis well ? Using condoms during sex  What are the risks of circumcision?  As with any surgical procedure, there are risks and complications. In circumcision, complications are rare and usually minor, the most common being: ? Bleeding- risk is reduced by holding each clamp for 30 seconds prior to a cut being made, and by holding pressure after the procedure is done ? Infection- the penis is cleaned prior to the procedure, and the procedure is done under sterile technique ? Damage to the urethra or amputation of the penis  How is circumcision done  in baby boys?  The baby will be placed on a special table and the legs restrained for their safety. Numbing medication is injected into the penis, and the skin is cleansed with betadine to decrease the risk of infection.   What to expect:  The penis will look red and raw for 5-7 days as it heals. We expect scabbing around where the cut was made, as well as clear-pink fluid and some swelling of the penis right after the procedure. If your baby's circumcision starts to bleed or develops pus, please contact your pediatrician immediately.  All questions were answered and mother consented.  Celedonio Savage, MD 8:13 AM

## 2022-06-03 NOTE — Lactation Note (Signed)
This note was copied from a baby's chart. Lactation Consultation Note  Patient Name: Misty Curry [id number] Date: 06/03/2022 Reason for consult: Follow-up assessment Age:30 hours  Birth parent denies questions or concerns. Reviewed engorgement care and monitoring voids/stools. Suggest calling for help as needed.    Feeding Mother's Current Feeding Choice: Breast Milk  Interventions Interventions: Education  Discharge Discharge Education: Engorgement and breast care;Warning signs for feeding baby  Consult Status Consult Status: Complete Date: 06/03/22    [name] [facility] 06/03/2022, 10:19 AM

## 2022-06-03 NOTE — Social Work (Signed)
CSW received consult for hx of Anxiety. CSW met with MOB to offer support and complete assessment.    CSW met with MOB at bedside and introduced CSW role. CSW observed MOB up in the room and FOB holding the infant at beside. CSW offered MOB privacy. MOB gave CSW permission to share all information with FOB present. MOB presented pleasant and engaged with CSW during the visit. CSW inquired how MOB has felt since giving birth. MOB presented calm and welcomed CSW visit. MOB stated, "I have felt lovely." MOB expressed the labor and delivery was "Carlena [place]ornstad" and "love and support made it easier." MOB acknowledged that she has a history of anxiety diagnosed at age 34. MOB reported in high school she saw a therapist and has not had "serious anxiety" since then. MOB expressed that she had anxiety surrounding miscarriage with this pregnancy since she had miscarried in the past. CSW validated MOB concerns. MOB shared that she often "talks with family to get it out" when she is worried. CSW acknowledged and encouraged MOB efforts. MOB stated, "he is my best friend" pointing to FOB. She identified FOB, her parents, friends, and family are also very supportive. CSW discussed PPD/PPA. MOB reported, "I was young and 37 when I had my daughter." MOB shared that she was single parent caring for a newborn and endorsed feeling overwhelmed. MOB expressed this time is different because she has "great supports." CSW assessed MOB for safety. MOB denied thoughts of harm to self and others.   CSW provided education regarding the baby blues period vs. perinatal mood disorders, discussed treatment and gave resources for mental health follow up if concerns arise.  CSW recommended MOB complete a self-evaluation during the postpartum time period using the New Mom Checklist from Postpartum Progress and encouraged MOB to contact a medical professional if symptoms are noted at any time. MOB reported she feels comfortable reaching out to her  provider if she has concerns.   MOB reported she has all items for the infant including a bassinet where the infant will sleep. MOB receives WIC/FS and will call to update both agencies about the birth. CSW provided review of Sudden Infant Death Syndrome (SIDS) precautions. MOB has chosen [facility] for the infant's follow up care. CSW assessed MOB for additional needs. MOB reported no further need.   CSW identifies no further need for intervention and no barriers to discharge at this time.    Kathrin Greathouse, MSW, LCSW [facility] [place] Worker  [phone] 06/03/2022  12:26 PM

## 2022-06-05 ENCOUNTER — Encounter: Payer: Medicaid Other | Admitting: Obstetrics and Gynecology

## 2022-06-06 ENCOUNTER — Encounter: Payer: Medicaid Other | Admitting: Obstetrics and Gynecology

## 2022-06-11 ENCOUNTER — Telehealth (HOSPITAL_COMMUNITY): Payer: Self-pay | Admitting: *Deleted

## 2022-06-11 NOTE — Telephone Encounter (Signed)
Hospital Discharge Follow-Up Call:  Patient reports that she is well and has no concerns about her healing process.  EPDS today was 0 and patient endorses this accurately reflects that she is doing well emotionally.  Patient says that baby is well and she has no concerns about baby's health.  She reports that baby sleeps in a bedside bassinet.  Reviewed ABCs of Safe Sleep.

## 2022-06-13 ENCOUNTER — Encounter: Payer: Medicaid Other | Admitting: Obstetrics and Gynecology

## 2022-07-18 ENCOUNTER — Ambulatory Visit (INDEPENDENT_AMBULATORY_CARE_PROVIDER_SITE_OTHER): Payer: Medicaid Other

## 2022-07-18 DIAGNOSIS — Z30431 Encounter for routine checking of intrauterine contraceptive device: Secondary | ICD-10-CM

## 2022-07-18 DIAGNOSIS — Z975 Presence of (intrauterine) contraceptive device: Secondary | ICD-10-CM | POA: Diagnosis not present

## 2022-07-18 NOTE — Progress Notes (Signed)
[place] Partum Visit Note  Misty Curry is a 31 y.o. [phone] female who presents for a postpartum visit. She is 6 weeks postpartum following a normal spontaneous vaginal delivery.  I have fully reviewed the prenatal and intrapartum course. The delivery was at [redacted]w[redacted]d.  Anesthesia: epidural. Postpartum course has been going well. Baby is doing well. Baby is feeding by breast. Bleeding pink. Bowel function is normal. Bladder function is normal. Patient is not sexually active. Contraception method is IUD. Postpartum depression screening: negative.  Patient is having increased shortness or breath and is needing to use her inhaler more often. Would like a refill on the inhaler.   The pregnancy intention screening data noted above was reviewed. Potential methods of contraception were discussed. The patient elected to proceed with No data recorded.  Patient reports that she is doing well and receives assistance, with infant care, from her SO, mother, and sister.  She endorses safety at home and denies depression.  She is breastfeeding and reports it is going "better."  She is also pumping with adequate emptying of breast.  Patient plans to return to work in December or February, but declines a note.    Health Maintenance Due  Topic Date Due   COVID-19 Vaccine (1) Never done   TETANUS/TDAP  Never done   INFLUENZA VACCINE  Never done    The following portions of the patient's history were reviewed and updated as appropriate: allergies, current medications, past family history, past medical history, past social history, past surgical history, and problem list.  Review of Systems Pertinent items are noted in HPI.  Objective:  There were no vitals taken for this visit.   General:  alert, cooperative, and no distress   Breasts:  Not Evaluated  Lungs: clear to auscultation bilaterally  Heart:  regular rate and rhythm  Abdomen: soft, non-tender; bowel sounds normal; no masses,  no organomegaly    Wound N/A  GU exam:  NEFG. Scant amt blood noted at introtus. Labia and perineum intake. Vaginal wall pink with good rugae.  Black IUD strings noted in vault at ~ 10cm.  Trimmed to 4cm. Cervix pink, no lesions, active bleeding removed with faux swab.  No IUD noted at external os. BME without tenderness uterine size appropriate. No IUD palpated.        Assessment:   Postpartum State IUD String Check Breastfeeding   Plan:   -Okay to return to work when desires. -Discussed return to sexual activity.  Encouraged usage of lubrication while breastfeeding. -Strings trimmed and patient allowed to feel.  Discussed management at home and returning for string checks yearly or prn if issues arise.  -Patient to follow up with PCP as appropriate.   Essential components of care per ACOG recommendations:  1.  Mood and well being: Patient with negative depression screening today. Reviewed local resources for support.  - Patient tobacco use? No.   - hx of drug use? No.    2. Infant care and feeding:  -Patient currently breastmilk feeding? Yes. Reviewed importance of draining breast regularly to support lactation.  -Social determinants of health (SDOH) reviewed in EPIC. No concerns  3. Sexuality, contraception and birth spacing - Patient does not want a pregnancy in the next year.  Desired family size is 2 children.  - Reviewed reproductive life planning. Reviewed contraceptive methods based on pt preferences and effectiveness.  Patient desired IUD or IUS today.   - Discussed birth spacing of 18 months  4. Sleep  and fatigue -Encouraged family/partner/community support of 4 hrs of uninterrupted sleep to help with mood and fatigue  5. Physical Recovery  - Discussed patients delivery and complications. She describes her labor as "great."  Reports delivery was "faster than last time" and the provider "did awesome." - Patient had a Vaginal, no problems at delivery. Patient had a  labial  laceration.  Perineal healing reviewed. Patient expressed understanding - Patient has urinary incontinence? No. - Patient is safe to resume physical and sexual activity  6.  Health Maintenance - HM due items addressed Yes - Last pap smear  Diagnosis  Date Value Ref Range Status  12/10/2021   Final   - Negative for intraepithelial lesion or malignancy (NILM)   Pap smear not done at today's visit.  -Breast Cancer screening indicated? No.   7. Chronic Disease/Pregnancy Condition follow up: None  - PCP follow up  Maryann Conners, [place] for [place]

## 2022-07-29 MED ORDER — ALBUTEROL SULFATE HFA 108 (90 BASE) MCG/ACT IN AERS: 1.0000 | INHALATION_SPRAY | Freq: Four times a day (QID) | RESPIRATORY_TRACT | 2 refills | Status: DC | PRN

## 2022-08-24 IMAGING — US US MFM OB DETAIL+14 WK
1 series · 13 of 28 positions shown · non-contrast
Comparison: none

[Series 1: us mfm ob detail+14 wk · 101 acquisitions, 13 frames shown]
[im 4/101]
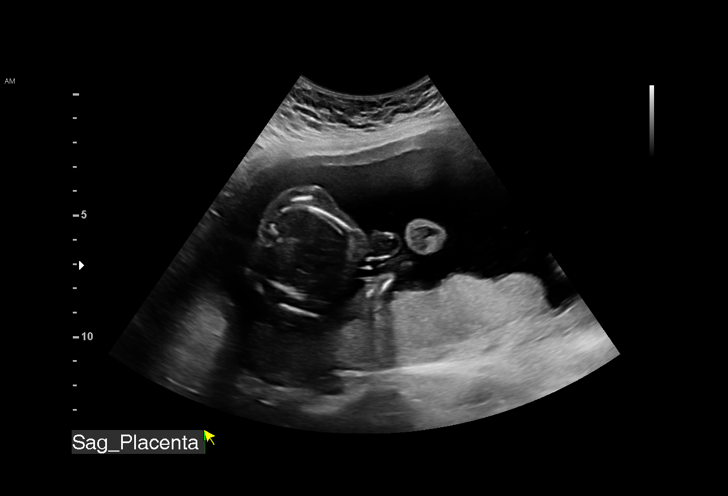
[im 12/101]
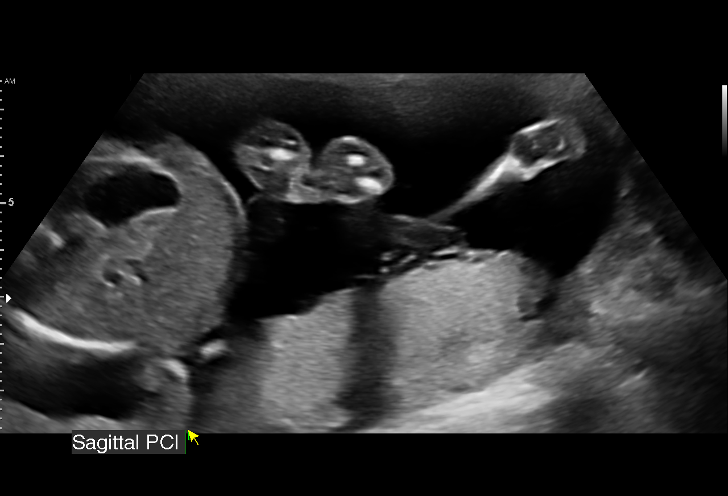
[im 19/101]
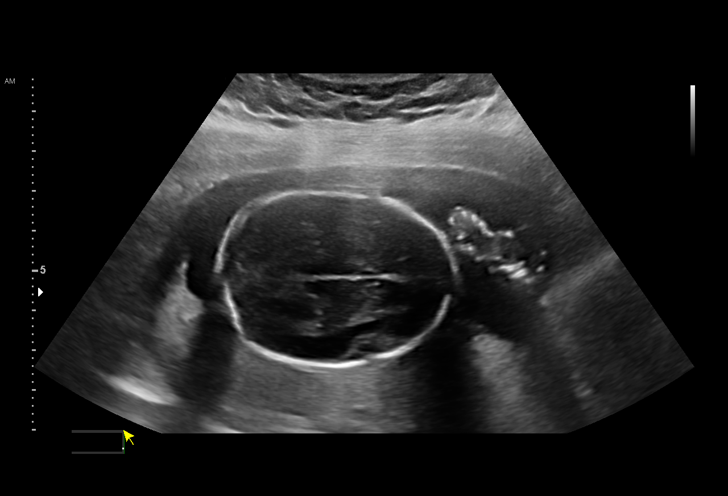
[im 26/101]
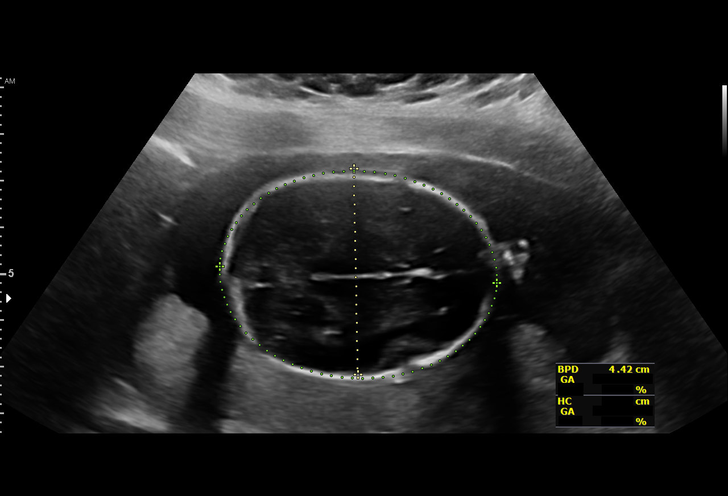
[im 34/101]
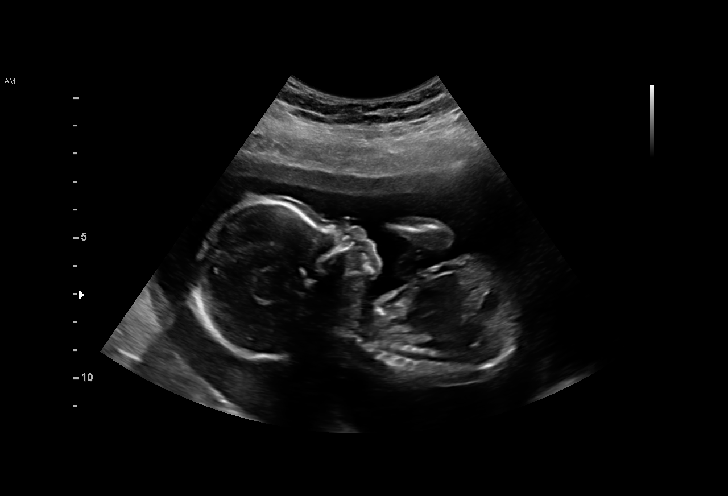
[im 41/101]
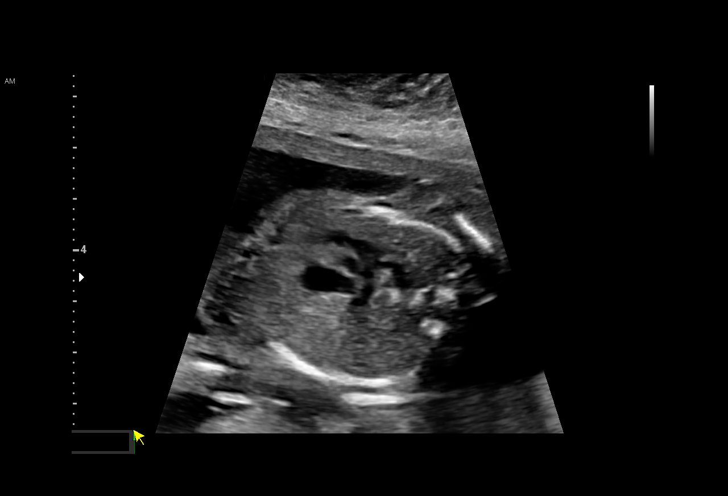
[im 52/101]
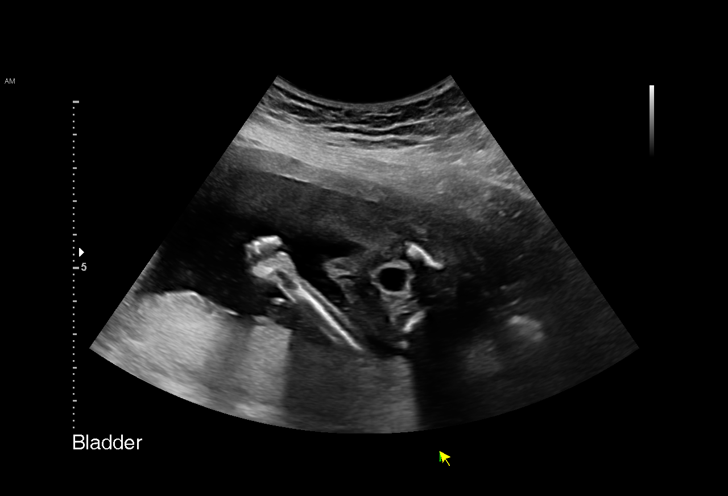
[im 60/101]
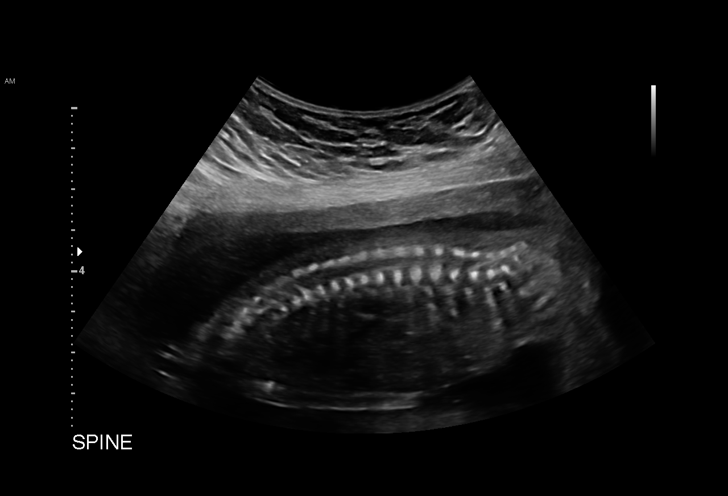
[im 67/101]
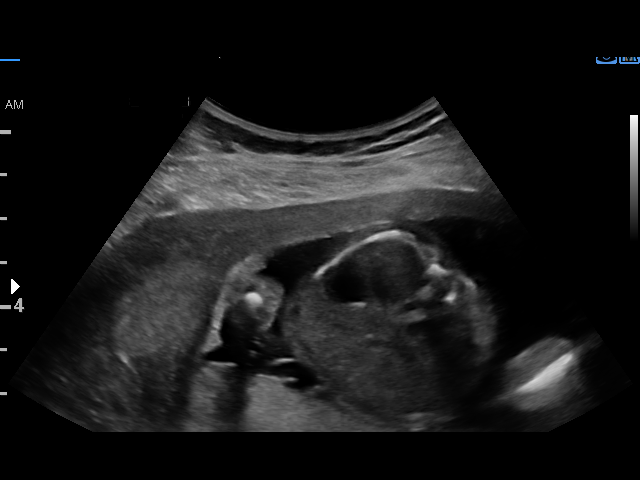
[im 75/101]
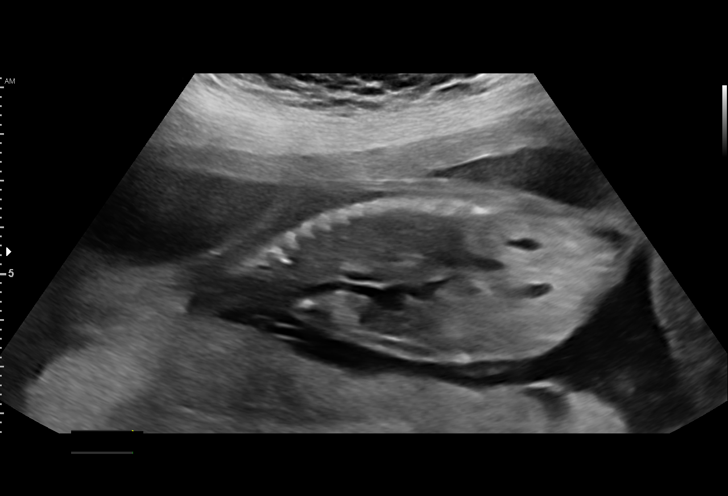
[im 82/101]
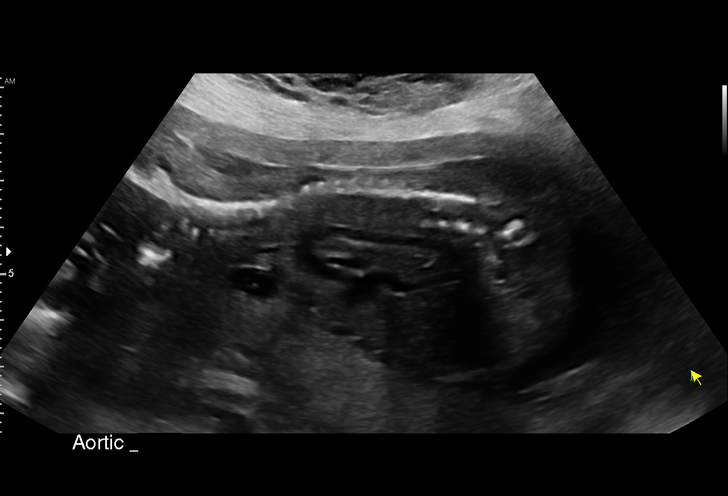
[im 89/101]
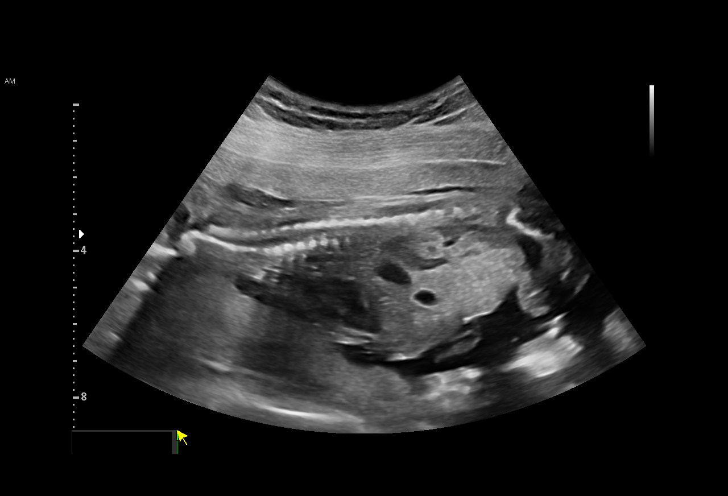
[im 97/101]
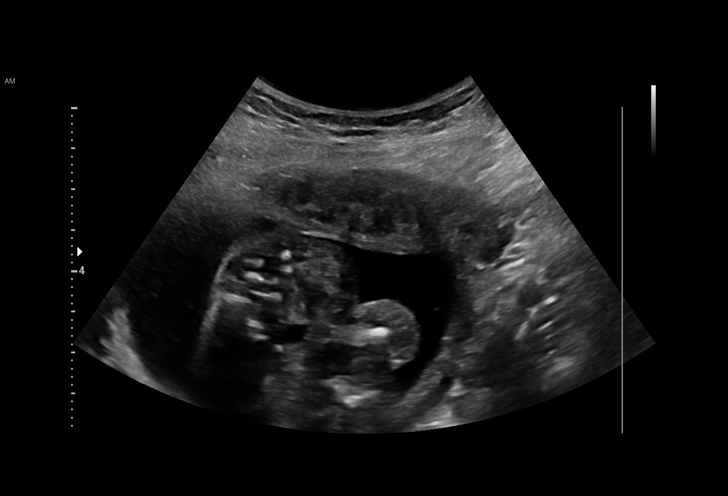

[13 of 28 positions shown; findings below may reference images not displayed]

Indications

 Obesity complicating pregnancy, second
 trimester (BMI 31)
 Marginal insertion of umbilical cord affecting
 management of mother in second trimester
 19 weeks gestation of pregnancy
 Antenatal screening for malformations
 LR NIPS
Fetal Evaluation

 Num Of Fetuses:         1
 Fetal Heart Rate(bpm):  136
 Cardiac Activity:       Observed
 Presentation:           Breech
 Placenta:               Posterior
 P. Cord Insertion:      Marginal insertion

 Amniotic Fluid
 AFI FV:      Within normal limits
Biometry

 BPD:      43.7  mm     G. Age:  19w 2d         60  %    CI:         71.4   %    70 - 86
                                                         FL/HC:      18.3   %    16.1 -
 HC:      164.7  mm     G. Age:  19w 1d         52  %    HC/AC:      1.16        1.09 -
 AC:      141.8  mm     G. Age:  19w 4d         64  %    FL/BPD:     69.1   %
 FL:       30.2  mm     G. Age:  19w 2d         56  %    FL/AC:      21.3   %    20 - 24
 CER:      19.2  mm     G. Age:  18w 5d         23  %
 NFT:       3.8  mm
 LV:        6.4  mm
 CM:        2.4  mm

 Est. FW:     291  gm    0 lb 10 oz      70  %
OB History

 Gravidity:    3         Term:   1        Prem:   0        SAB:   1
 TOP:          0       Ectopic:  0        Living: 1
Gestational Age

 LMP:           20w 2d        Date:  08/28/21                 EDD:   06/04/22
 U/S Today:     19w 2d                                        EDD:   06/11/22
 Best:          19w 0d     Det. By:  U/S C R L  (12/10/21)    EDD:   06/13/22
Anatomy

 Cranium:               Appears normal         Aortic Arch:            Appears normal
 Cavum:                 Appears normal         Ductal Arch:            Appears normal
 Ventricles:            Appears normal         Diaphragm:              Appears normal
 Choroid Plexus:        Appears normal         Stomach:                Appears normal, left
                                                                       sided
 Cerebellum:            Appears normal         Abdomen:                Appears normal
 Posterior Fossa:       Appears normal         Abdominal Wall:         Appears nml (cord
                                                                       insert, abd wall)
 Nuchal Fold:           Appears normal         Cord Vessels:           Appears normal (3
                                                                       vessel cord)
 Face:                  Appears normal         Kidneys:                Appear normal
                        (orbits and profile)
 Lips:                  Appears normal         Bladder:                Appears normal
 Thoracic:              Appears normal         Spine:                  Appears normal
 Heart:                 Appears normal         Upper Extremities:      Appears normal
                        (4CH, axis, and
                        situs)
 RVOT:                  Appears normal         Lower Extremities:      Appears normal
 LVOT:                  Appears normal

 Other:  VC, 3VV and 3VTV visualized. Nasal bone, lenses, maxilla, mandible
         and falx visualized.  Heels/feet and open hands/5th digits visualized.
         Fetus appears to be a male.
Cervix Uterus Adnexa

 Cervix
 Length:           3.76  cm.
 Normal appearance by transabdominal scan.

 Uterus
 No abnormality visualized.

 Right Ovary
 Within normal limits.

 Left Ovary
 Within normal limits.
Impression
 G4 P1. Patient is here for fetal anatomy scan.
 Obstetric history is significant for a term vaginal delivery.
 On cell-free fetal DNA screening, the risks of fetal
 aneuploidies are not increased .

 We performed fetal anatomy scan. No makers of
 aneuploidies or fetal structural defects are seen. Fetal
 biometry is consistent with her previously-established dates.
 Amniotic fluid is normal and good fetal activity is seen.
 Marginal cord insertion is seen. I explained the finding that
 marginal cord insertion is not usually associated with fetal
 adverse outcomes. However, in some cases, it can be
 associated with fetal growth restriction. We recommend serial
 fetal growth assessments till delivery.
 Patient understands the limitations of ultrasound in detecting
 fetal anomalies.
Recommendations

 -An appointment was made for her to return in 6 weeks for
 fetal growth assessment.
                Gosh, [place]

## 2022-09-18 DIAGNOSIS — F418 Other specified anxiety disorders: Secondary | ICD-10-CM | POA: Diagnosis not present

## 2022-09-18 DIAGNOSIS — F53 Postpartum depression: Secondary | ICD-10-CM | POA: Diagnosis not present

## 2022-09-18 DIAGNOSIS — O99345 Other mental disorders complicating the puerperium: Secondary | ICD-10-CM | POA: Diagnosis not present

## 2022-10-21 ENCOUNTER — Telehealth: Payer: Self-pay

## 2022-10-21 NOTE — Telephone Encounter (Signed)
TC to patient regarding extended disability leave. Pt has been seen by [facility] for postpartum depression. This nurse informed the pt she would need to reach out to the provider she has been seeing at [facility] regarding the extended leave. Pt verbalized understanding and had no further questions.

## 2022-10-22 DIAGNOSIS — F53 Postpartum depression: Secondary | ICD-10-CM | POA: Diagnosis not present

## 2022-10-22 DIAGNOSIS — F418 Other specified anxiety disorders: Secondary | ICD-10-CM | POA: Diagnosis not present

## 2022-10-22 DIAGNOSIS — O99345 Other mental disorders complicating the puerperium: Secondary | ICD-10-CM | POA: Diagnosis not present

## 2022-12-01 ENCOUNTER — Other Ambulatory Visit: Payer: Self-pay

## 2022-12-01 ENCOUNTER — Emergency Department (HOSPITAL_BASED_OUTPATIENT_CLINIC_OR_DEPARTMENT_OTHER)
Admission: EM | Admit: 2022-12-01 | Discharge: 2022-12-01 | Disposition: A | Discharge status: Home / Self Care | Payer: Medicaid Other | Attending: Emergency Medicine | Admitting: Emergency Medicine

## 2022-12-01 ENCOUNTER — Encounter (HOSPITAL_BASED_OUTPATIENT_CLINIC_OR_DEPARTMENT_OTHER): Payer: Self-pay | Admitting: Emergency Medicine

## 2022-12-01 DIAGNOSIS — W458XXA Other foreign body or object entering through skin, initial encounter: Secondary | ICD-10-CM | POA: Insufficient documentation

## 2022-12-01 DIAGNOSIS — H5711 Ocular pain, right eye: Secondary | ICD-10-CM | POA: Diagnosis present

## 2022-12-01 DIAGNOSIS — S0501XA Injury of conjunctiva and corneal abrasion without foreign body, right eye, initial encounter: Secondary | ICD-10-CM | POA: Insufficient documentation

## 2022-12-01 MED ORDER — FLUORESCEIN SODIUM 1 MG OP STRP
1.0000 | ORAL_STRIP | Freq: Once | OPHTHALMIC | Status: AC
  Administered 2022-12-01: 1 via OPHTHALMIC
  Filled 2022-12-01: qty 1

## 2022-12-01 MED ORDER — ERYTHROMYCIN 5 MG/GM OP OINT
TOPICAL_OINTMENT | Freq: Once | OPHTHALMIC | Status: DC
  Filled 2022-12-01: qty 3.5

## 2022-12-01 MED ORDER — TETRACAINE HCL 0.5 % OP SOLN
2.0000 [drp] | Freq: Once | OPHTHALMIC | Status: AC
  Administered 2022-12-01: 2 [drp] via OPHTHALMIC
  Filled 2022-12-01: qty 4

## 2022-12-01 NOTE — ED Triage Notes (Signed)
Pt presents to ED POV. Pt c/o R eye pain. Per pt she accidentally poked her with her acrylic nail.

## 2022-12-01 NOTE — ED Provider Notes (Signed)
[facility] Provider Note   CSN: [id number] Arrival date & time: 12/01/22  1108     History  Chief Complaint  Patient presents with   Eye Pain    Misty Curry is a 32 y.o. female.  HPI Patient reports that yesterday about 4 PM she accidentally scratched her self in the right eye with her acrylic nail.  She reports has become very painful.  Is burning and tearing.  She has been trying Visine drops and ibuprofen for pain control.    Home Medications Prior to Admission medications   Medication Sig Start Date End Date Taking? Authorizing Provider  albuterol (VENTOLIN HFA) 108 (90 Base) MCG/ACT inhaler Inhale 1-2 puffs into the lungs every 6 (six) hours as needed for wheezing or shortness of breath. 07/29/22   Gavin Pound, CNM  Blood Pressure Monitoring (BLOOD PRESSURE KIT) DEVI 1 kit by Does not apply route once a week. Check Blood Pressure regularly and record readings into the Babyscripts App.  Large Cuff.  DX O90.0 12/07/21   Shelly Bombard, MD  ferrous sulfate (FERROUSUL) 325 (65 FE) MG tablet Take 1 tablet (325 mg total) by mouth every other day. 05/02/22   Constant, Peggy, MD  Prenatal Vit-Fe Fumarate-FA (PRENATAL VITAMINS PO) Take by mouth.    [provider]  dicyclomine (BENTYL) 20 MG tablet Take 1 tablet (20 mg total) by mouth 2 (two) times daily. 12/05/14 07/04/20  Larene Pickett, PA-C  Ferrous Fumarate (IRON) 18 MG TBCR Take 1 tablet (18 mg total) by mouth every morning. Patient not taking: Reported on 12/05/2014 01/01/14 07/04/20  Lahoma Crocker, MD  metoCLOPramide (REGLAN) 10 MG tablet Take 1 tablet (10 mg total) by mouth 3 (three) times daily as needed for nausea (headache / nausea). 12/05/14 07/04/20  Larene Pickett, PA-C      Allergies    Patient has no known allergies.    Review of Systems   Review of Systems  Physical Exam Updated Vital Signs BP 128/86 (BP Location: Right Arm)   Pulse 82   Temp  98.7 F (37.1 C) (Oral)   Resp 18   SpO2 100%  Physical Exam Constitutional:      Appearance: Normal appearance.  Eyes:     Comments: Pupils equally round reactive to light.  Extraocular motions intact.  Very mild lid edema on the right.  No hyphema.  Small amount of fluorescein uptake on the cornea yes to the left of the pupil.  Much larger amount of fluorescein uptake on the sclera in the right corner of the eye.  Skin:    General: Skin is warm and dry.  Neurological:     General: No focal deficit present.     Mental Status: She is alert and oriented to person, place, and time.  Psychiatric:        Mood and Affect: Mood normal.     ED Results / Procedures / Treatments   Labs (all labs ordered are listed, but only abnormal results are displayed) Labs Reviewed - No data to display  EKG None  Radiology No results found.  Procedures Procedures    Medications Ordered in ED Medications  erythromycin ophthalmic ointment (has no administration in time range)  tetracaine (PONTOCAINE) 0.5 % ophthalmic solution 2 drop (2 drops Right Eye Given 12/01/22 1232)  fluorescein ophthalmic strip 1 strip (1 strip Right Eye Given 12/01/22 1232)    ED Course/ Medical Decision Making/ A&P  Medical Decision Making Risk Prescription drug management.   Patient has a small corneal abrasion from her acrylic nail.  She has a somewhat larger abrasion to the sclera.  No evidence of hyphema or significant trauma.  Maxie Better will be for erythromycin ophthalmic ointment 4 times a day.  She has been counseled on using ibuprofen for pain control and cool compresses.  Return precautions provided in discharge instructions.        Final Clinical Impression(s) / ED Diagnoses Final diagnoses:  Abrasion of right cornea, initial encounter    Rx / DC Orders ED Discharge Orders     None         Charlesetta Shanks, MD 12/01/22 1241

## 2022-12-01 NOTE — Discharge Instructions (Signed)
1.  Apply erythromycin ophthalmic ointment 4 times a day for the next 5 days.  2.  You may wear dark glasses to help with pain with looking at lights. 3.  You may use cold compresses to help with swelling and pain.  Try to avoid putting any pressure on the eye. 4.  You may use ibuprofen 4 to 600 mg every 6 hours for pain. 5.  Most corneal abrasions heal quickly.  Return to emergency room department for recheck if you are getting increasing redness and swelling, drainage from the eye, loss of vision or other concerning changes.

## 2022-12-04 DIAGNOSIS — F418 Other specified anxiety disorders: Secondary | ICD-10-CM | POA: Diagnosis not present

## 2022-12-04 DIAGNOSIS — E559 Vitamin D deficiency, unspecified: Secondary | ICD-10-CM | POA: Diagnosis not present

## 2022-12-04 DIAGNOSIS — K5903 Drug induced constipation: Secondary | ICD-10-CM | POA: Diagnosis not present

## 2022-12-04 DIAGNOSIS — O99345 Other mental disorders complicating the puerperium: Secondary | ICD-10-CM | POA: Diagnosis not present

## 2022-12-04 DIAGNOSIS — F53 Postpartum depression: Secondary | ICD-10-CM | POA: Diagnosis not present

## 2022-12-04 DIAGNOSIS — E611 Iron deficiency: Secondary | ICD-10-CM | POA: Diagnosis not present

## 2023-01-02 ENCOUNTER — Emergency Department (HOSPITAL_BASED_OUTPATIENT_CLINIC_OR_DEPARTMENT_OTHER)
Admission: EM | Admit: 2023-01-02 | Discharge: 2023-01-02 | Disposition: A | Discharge status: Home / Self Care | Payer: Medicaid Other | Attending: Emergency Medicine | Admitting: Emergency Medicine

## 2023-01-02 ENCOUNTER — Other Ambulatory Visit: Payer: Self-pay

## 2023-01-02 ENCOUNTER — Encounter (HOSPITAL_BASED_OUTPATIENT_CLINIC_OR_DEPARTMENT_OTHER): Payer: Self-pay | Admitting: Emergency Medicine

## 2023-01-02 DIAGNOSIS — J45901 Unspecified asthma with (acute) exacerbation: Secondary | ICD-10-CM | POA: Diagnosis not present

## 2023-01-02 DIAGNOSIS — R0689 Other abnormalities of breathing: Secondary | ICD-10-CM | POA: Diagnosis present

## 2023-01-02 MED ORDER — CETIRIZINE HCL 10 MG PO TABS: 10.0000 mg | ORAL_TABLET | Freq: Every day | ORAL | 0 refills | Status: AC

## 2023-01-02 MED ORDER — ALBUTEROL SULFATE (2.5 MG/3ML) 0.083% IN NEBU
2.5000 mg | INHALATION_SOLUTION | Freq: Once | RESPIRATORY_TRACT | Status: AC
  Administered 2023-01-02: 2.5 mg via RESPIRATORY_TRACT
  Filled 2023-01-02: qty 3

## 2023-01-02 MED ORDER — IPRATROPIUM-ALBUTEROL 0.5-2.5 (3) MG/3ML IN SOLN
3.0000 mL | Freq: Once | RESPIRATORY_TRACT | Status: AC
  Administered 2023-01-02: 3 mL via RESPIRATORY_TRACT
  Filled 2023-01-02: qty 3

## 2023-01-02 MED ORDER — PREDNISONE 10 MG (21) PO TBPK: ORAL_TABLET | Freq: Every day | ORAL | 0 refills | Status: DC

## 2023-01-02 MED ORDER — DEXAMETHASONE SODIUM PHOSPHATE 10 MG/ML IJ SOLN
10.0000 mg | Freq: Once | INTRAMUSCULAR | Status: AC
  Administered 2023-01-02: 10 mg
  Filled 2023-01-02: qty 1

## 2023-01-02 MED ORDER — ALBUTEROL SULFATE HFA 108 (90 BASE) MCG/ACT IN AERS: 1.0000 | INHALATION_SPRAY | RESPIRATORY_TRACT | 1 refills | Status: DC | PRN

## 2023-01-02 MED ORDER — ALBUTEROL SULFATE HFA 108 (90 BASE) MCG/ACT IN AERS
2.0000 | INHALATION_SPRAY | RESPIRATORY_TRACT | Status: DC | PRN
  Administered 2023-01-02: 2 via RESPIRATORY_TRACT
  Filled 2023-01-02: qty 6.7

## 2023-01-02 NOTE — ED Provider Notes (Signed)
[facility] Provider Note  CSN: [id number] Arrival date & time: 01/02/23 1201  Chief Complaint(s) Asthma  HPI Misty Curry is a 32 y.o. female with past medical history as below, significant for asthma, seasonal allergies who presents to the ED with complaint of asthma exacerbation.  Patient reports over the past 2 days her asthma has become worsened from baseline, she attributes this to the season change, worsening pollen.  She unfortunately has lost her albuterol inhaler, she was in the store today and began having worsening difficulty breathing.  Nonproductive cough.  Difficulty taking full inspiration.  Report to the ED for evaluation.  No intubation history reported.  No fevers or chills, no chest pain.  No recent travel or sick contacts.  No longer breast-feeding  Past Medical History Past Medical History:  Diagnosis Date   Anxiety    Asthma    Vaginal Pap smear, abnormal    Patient Active Problem List   Diagnosis Date Noted   Normal labor 06/02/2022   Tinea corporis 04/04/2022   Cervical dysplasia 12/14/2021   Trichomonal vaginitis during pregnancy in first trimester 12/14/2021   BMI 31.0-31.9,adult 12/10/2021   Home Medication(s) Prior to Admission medications   Medication Sig Start Date End Date Taking? Authorizing Provider  albuterol (VENTOLIN HFA) 108 (90 Base) MCG/ACT inhaler Inhale 1-2 puffs into the lungs every 4 (four) hours as needed for wheezing or shortness of breath. 01/02/23  Yes Wynona Dove A, DO  cetirizine (ZYRTEC ALLERGY) 10 MG tablet Take 1 tablet (10 mg total) by mouth daily. 01/02/23 02/01/23 Yes Jeanell Sparrow, DO  predniSONE (STERAPRED UNI-PAK 21 TAB) 10 MG (21) TBPK tablet Take by mouth daily. Take 6 tabs by mouth daily  for 2 days, then 5 tabs for 2 days, then 4 tabs for 2 days, then 3 tabs for 2 days, 2 tabs for 2 days, then 1 tab by mouth daily for 2 days 01/02/23  Yes Wynona Dove A, DO  albuterol  (VENTOLIN HFA) 108 (90 Base) MCG/ACT inhaler Inhale 1-2 puffs into the lungs every 6 (six) hours as needed for wheezing or shortness of breath. 07/29/22   Gavin Pound, CNM  Blood Pressure Monitoring (BLOOD PRESSURE KIT) DEVI 1 kit by Does not apply route once a week. Check Blood Pressure regularly and record readings into the [employer] App.  Large Cuff.  DX O90.0 12/07/21   Shelly Bombard, MD  ferrous sulfate (FERROUSUL) 325 (65 FE) MG tablet Take 1 tablet (325 mg total) by mouth every other day. 05/02/22   Constant, Peggy, MD  Prenatal Vit-Fe Fumarate-FA (PRENATAL VITAMINS PO) Take by mouth.    [provider]  dicyclomine (BENTYL) 20 MG tablet Take 1 tablet (20 mg total) by mouth 2 (two) times daily. 12/05/14 07/04/20  Larene Pickett, PA-C  Ferrous Fumarate (IRON) 18 MG TBCR Take 1 tablet (18 mg total) by mouth every morning. Patient not taking: Reported on 12/05/2014 01/01/14 07/04/20  Lahoma Crocker, MD  metoCLOPramide (REGLAN) 10 MG tablet Take 1 tablet (10 mg total) by mouth 3 (three) times daily as needed for nausea (headache / nausea). 12/05/14 07/04/20  Larene Pickett, PA-C  Past Surgical History Past Surgical History:  Procedure Laterality Date   HERNIA REPAIR     MOLE REMOVAL     Family History Family History  Problem Relation Age of Onset   Healthy Mother    Healthy Father    Alcohol abuse Neg Hx    Asthma Neg Hx    Arthritis Neg Hx    Birth defects Neg Hx    Cancer Neg Hx    COPD Neg Hx    Depression Neg Hx    Diabetes Neg Hx    Drug abuse Neg Hx    Early death Neg Hx    Hearing loss Neg Hx    Heart disease Neg Hx    Hyperlipidemia Neg Hx    Hypertension Neg Hx    Kidney disease Neg Hx    Learning disabilities Neg Hx    Mental illness Neg Hx    Mental retardation Neg Hx    Miscarriages / Stillbirths Neg Hx    Stroke Neg Hx     Vision loss Neg Hx    Varicose Veins Neg Hx     Social History Social History   Tobacco Use   Smoking status: Never   Smokeless tobacco: Never  Vaping Use   Vaping Use: Never used  Substance Use Topics   Alcohol use: No   Drug use: Never   Allergies Patient has no known allergies.  Review of Systems Review of Systems  Constitutional:  Negative for activity change and fever.  HENT:  Negative for facial swelling and trouble swallowing.   Eyes:  Negative for discharge and redness.  Respiratory:  Positive for cough, chest tightness and shortness of breath.   Cardiovascular:  Negative for chest pain and palpitations.  Gastrointestinal:  Negative for abdominal pain and nausea.  Genitourinary:  Negative for dysuria and flank pain.  Musculoskeletal:  Negative for back pain and gait problem.  Skin:  Negative for pallor and rash.  Neurological:  Negative for syncope and headaches.    Physical Exam Vital Signs  I have reviewed the triage vital signs BP 126/76   Pulse 85   Temp 97.9 F (36.6 C)   Resp (!) 24   Ht 5\' 3"  (1.6 m)   Wt 86.2 kg   SpO2 100%   BMI 33.66 kg/m  Physical Exam Vitals and nursing note reviewed.  Constitutional:      General: She is not in acute distress.    Appearance: Normal appearance.  HENT:     Head: Normocephalic and atraumatic.     Right Ear: External ear normal.     Left Ear: External ear normal.     Nose: Nose normal.     Mouth/Throat:     Mouth: Mucous membranes are moist.  Eyes:     General: No scleral icterus.       Right eye: No discharge.        Left eye: No discharge.  Cardiovascular:     Rate and Rhythm: Normal rate and regular rhythm.     Pulses: Normal pulses.     Heart sounds: Normal heart sounds.  Pulmonary:     Effort: Pulmonary effort is normal. No respiratory distress.     Breath sounds: No stridor. Wheezing present.     Comments: Trace b/l Abdominal:     General: Abdomen is flat.     Palpations: Abdomen is  soft.     Tenderness: There is no abdominal tenderness.  Musculoskeletal:  Right lower leg: No edema.     Left lower leg: No edema.  Skin:    General: Skin is warm and dry.     Capillary Refill: Capillary refill takes less than 2 seconds.  Neurological:     Mental Status: She is alert.  Psychiatric:        Mood and Affect: Mood normal.        Behavior: Behavior normal.     ED Results and Treatments Labs (all labs ordered are listed, but only abnormal results are displayed) Labs Reviewed - No data to display                                                                                                                        Radiology No results found.  Pertinent labs & imaging results that were available during my care of the patient were reviewed by me and considered in my medical decision making (see MDM for details).  Medications Ordered in ED Medications  albuterol (VENTOLIN HFA) 108 (90 Base) MCG/ACT inhaler 2 puff (2 puffs Inhalation Given 01/02/23 1223)  ipratropium-albuterol (DUONEB) 0.5-2.5 (3) MG/3ML nebulizer solution 3 mL (3 mLs Nebulization Given 01/02/23 1213)  albuterol (PROVENTIL) (2.5 MG/3ML) 0.083% nebulizer solution 2.5 mg (2.5 mg Nebulization Given 01/02/23 1213)  dexamethasone (DECADRON) injection 10 mg (10 mg Other Given 01/02/23 1259)                                                                                                                                     Procedures Procedures  (including critical care time)  Medical Decision Making / ED Course    Medical Decision Making:    SHAQUIDA LEMAR is a 32 y.o. female  with past medical history as below, significant for asthma, seasonal allergies who presents to the ED with complaint of asthma exacerbation. . The complaint involves an extensive differential diagnosis and also carries with it a high risk of complications and morbidity.  Serious etiology was considered. Ddx includes but is not  limited to: In my evaluation of this patient's dyspnea my DDx includes, but is not limited to, pneumonia, pulmonary embolism, pneumothorax, pulmonary edema, metabolic acidosis, asthma, COPD, cardiac cause, anemia, anxiety, etc.    Complete initial physical exam performed, notably the patient  was no acute distress, breathing comfortably on ambient air no stridor.  Trace wheezing noted.    Reviewed and  confirmed nursing documentation for past medical history, family history, social history.  Vital signs reviewed.    Clinical Course as of 01/02/23 1409  Thu Jan 02, 2023  1408 Feeling better on recheck, no wheezing.  No hypoxia.   [SG]    Clinical Course User Index [SG] Wynona Dove A, DO   No respiratory distress, will give nebulized breathing treatment, steroid. Albuterol inhaler for home, antihistamine, follow-up with pcp/asthma specialist    Patient presentation is consistent with mild-moderate asthma exacerbation. Other pulmonary or serious thoracic etiology appears very unlikely. Improved with albuterol/atrovent breathing treatments and steroids. Appears appropriate for OP management with PCP F/U. The patient improved significantly and was discharged in stable condition. Detailed discussions were had with the patient regarding current findings, and need for close f/u with PCP or on call doctor. The patient has been instructed to return immediately if the symptoms worsen in any way for re-evaluation. Patient verbalized understanding and is in agreement with current care plan. All questions answered prior to discharge.      Additional history obtained: -Additional history obtained from family -External records from outside source obtained and reviewed including: Chart review including previous notes, labs, imaging, consultation notes including prior ED visits, home medications, prior labs and imaging   Lab Tests: na  EKG   EKG Interpretation  Date/Time:    Ventricular Rate:     PR Interval:    QRS Duration:   QT Interval:    QTC Calculation:   R Axis:     Text Interpretation:           Imaging Studies ordered: na   Medicines ordered and prescription drug management: Meds ordered this encounter  Medications   ipratropium-albuterol (DUONEB) 0.5-2.5 (3) MG/3ML nebulizer solution 3 mL   albuterol (PROVENTIL) (2.5 MG/3ML) 0.083% nebulizer solution 2.5 mg   albuterol (VENTOLIN HFA) 108 (90 Base) MCG/ACT inhaler 2 puff   dexamethasone (DECADRON) injection 10 mg    Give po   cetirizine (ZYRTEC ALLERGY) 10 MG tablet    Sig: Take 1 tablet (10 mg total) by mouth daily.    Dispense:  30 tablet    Refill:  0   albuterol (VENTOLIN HFA) 108 (90 Base) MCG/ACT inhaler    Sig: Inhale 1-2 puffs into the lungs every 4 (four) hours as needed for wheezing or shortness of breath.    Dispense:  1 each    Refill:  1   predniSONE (STERAPRED UNI-PAK 21 TAB) 10 MG (21) TBPK tablet    Sig: Take by mouth daily. Take 6 tabs by mouth daily  for 2 days, then 5 tabs for 2 days, then 4 tabs for 2 days, then 3 tabs for 2 days, 2 tabs for 2 days, then 1 tab by mouth daily for 2 days    Dispense:  42 tablet    Refill:  0    -I have reviewed the patients home medicines and have made adjustments as needed   Consultations Obtained: na   Cardiac Monitoring: The patient was maintained on a cardiac monitor.  I personally viewed and interpreted the cardiac monitored which showed an underlying rhythm of: SR, pulse ox 99-100% on continuous monitor  Social Determinants of Health:  Diagnosis or treatment significantly limited by social determinants of health: obesity   Reevaluation: After the interventions noted above, I reevaluated the patient and found that they have improved  Co morbidities that complicate the patient evaluation  Past Medical History:  Diagnosis Date   Anxiety  Asthma    Vaginal Pap smear, abnormal       Dispostion: Disposition decision including  need for hospitalization was considered, and patient discharged from emergency department.    Final Clinical Impression(s) / ED Diagnoses Final diagnoses:  Mild asthma exacerbation     This chart was dictated using voice recognition software.  Despite best efforts to proofread,  errors can occur which can change the documentation meaning.    Wynona Dove A, DO 01/02/23 1409

## 2023-01-02 NOTE — Discharge Instructions (Signed)
It was a pleasure caring for you today in the emergency department. ° °Please return to the emergency department for any worsening or worrisome symptoms. ° ° °

## 2023-01-02 NOTE — ED Notes (Signed)
RN provided AVS using Teachback Method. Patient verbalizes understanding of Discharge Instructions. Opportunity for Questioning and Answers were provided by RN. Patient Discharged from ED ambulatory to Home. ° °

## 2023-01-02 NOTE — ED Triage Notes (Signed)
Pt arrives to ED with c/o asthma attack. She notes she was walking at the store and became SOB. She has not used Albuterol inhaler because she has lost it.

## 2023-01-27 DIAGNOSIS — J4521 Mild intermittent asthma with (acute) exacerbation: Secondary | ICD-10-CM | POA: Diagnosis not present

## 2023-01-27 DIAGNOSIS — J3089 Other allergic rhinitis: Secondary | ICD-10-CM | POA: Diagnosis not present

## 2023-02-05 DIAGNOSIS — E611 Iron deficiency: Secondary | ICD-10-CM | POA: Diagnosis not present

## 2023-02-05 DIAGNOSIS — Z6834 Body mass index (BMI) 34.0-34.9, adult: Secondary | ICD-10-CM | POA: Diagnosis not present

## 2023-02-05 DIAGNOSIS — E559 Vitamin D deficiency, unspecified: Secondary | ICD-10-CM | POA: Diagnosis not present

## 2023-02-05 DIAGNOSIS — J3089 Other allergic rhinitis: Secondary | ICD-10-CM | POA: Diagnosis not present

## 2023-02-05 DIAGNOSIS — E6609 Other obesity due to excess calories: Secondary | ICD-10-CM | POA: Diagnosis not present

## 2023-02-05 DIAGNOSIS — Z0000 Encounter for general adult medical examination without abnormal findings: Secondary | ICD-10-CM | POA: Diagnosis not present

## 2023-02-05 DIAGNOSIS — J4520 Mild intermittent asthma, uncomplicated: Secondary | ICD-10-CM | POA: Diagnosis not present

## 2023-02-16 NOTE — Progress Notes (Unsigned)
New Patient Note  RE: Misty Curry MRN: [id number] DOB: 1991-07-04 Date of Office Visit: 02/17/2023  Consult requested by: Sloan Leiter, DO Primary care provider: Ralene Ok PA (PCP)  Chief Complaint: Allergy Testing, Wheezing, Cough, and Nasal Congestion (C/o symptoms by 3 months)  History of Present Illness: I had the pleasure of seeing Misty Curry for initial evaluation at the [facility] on 02/17/2023. She is a 32 y.o. female, who is referred here by Ralene Ok PA (PCP) for the evaluation of asthma and allergic rhinitis.  Asthma:  She reports symptoms of chest tightness, shortness of breath, coughing, wheezing, nocturnal awakenings for 2 years - worsening the past 3 months. Current medications include albuterol prn which help. She reports using aerochamber with inhalers. She tried the following inhalers: none. Main triggers are allergies, infections, heat. In the last month, frequency of symptoms: daily. Frequency of nocturnal symptoms: depends. Frequency of SABA use: at max 5x/daily. Interference with physical activity: sometimes. Sleep is disturbed.   In the last 12 months, emergency room visits/urgent care visits/doctor office visits or hospitalizations due to respiratory issues: 2. In the last 12 months, oral steroids courses: yes. Lifetime history of hospitalization for respiratory issues: no. Prior intubations: no. History of pneumonia: once in the past. She was not evaluated by allergist/pulmonologist in the past. Smoking exposure: marijuana in the past. Up to date with flu vaccine: no. Up to date with COVID-19 vaccine: no. Prior Covid-19 infection: no. History of reflux: denies.  Rhinitis: She reports symptoms of nasal congestion, itchy eyes. Symptoms have been going on for 15+ years. The symptoms are present mainly in the spring and summer.  Anosmia: no. Headache: yes. She has used zyrtec, Singulair, Flonase with some improvement in symptoms.  Sinus infections: no. Previous work up includes: none. Previous ENT evaluation: no. Previous sinus imaging: no. History of nasal polyps: no. Last eye exam: 2024.  01/02/2023 ER visit: "Misty Curry is a 32 y.o. female  with past medical history as below, significant for asthma, seasonal allergies who presents to the ED with complaint of asthma exacerbation.   No respiratory distress, will give nebulized breathing treatment, steroid. Albuterol inhaler for home, antihistamine, follow-up with pcp/asthma specialist"  Assessment and Plan: Misty Curry is a 32 y.o. female with: Asthma, not well controlled Diagnosed with asthma 2 years ago and having worsening symptoms at past 3 months requiring 2 courses of prednisone.  Currently has daily symptoms and sometimes uses albuterol up to 5 times per day with some benefit.  Triggers include allergies, infections and heat. Today's spirometry showed moderate obstruction with 40% improvement in FEV1 postbronchodilator treatment.  Clinically feeling much improved. Start prednisone taper. Prednisone 10mg  tablets - take 2 tablets for 4 days then 1 tablet on day 5.  Daily controller medication(s): START Symbicort 2 puffs twice a day with spacer and rinse mouth afterwards. Spacer given and demonstrated proper use with inhaler. Patient understood technique and all questions/concerned were addressed.  Continue Singulair (montelukast) 10mg  daily at night. May use albuterol rescue inhaler 2 puffs every 4 to 6 hours as needed for shortness of breath, chest tightness, coughing, and wheezing. May use albuterol rescue inhaler 2 puffs 5 to 15 minutes prior to strenuous physical activities. Monitor frequency of use.  Get spirometry at next visit.  Other allergic rhinitis Rhinoconjunctivitis symptoms mainly in the spring and summer.  Tried Zyrtec, Singulair and Flonase with some benefit.  No prior allergy evaluation. Unable to skin test  today due to poor  respiratory status. Will plan on skin testing at next visit. See below for environmental control measures for pollen. Use over the counter antihistamines such as Zyrtec (cetirizine), Claritin (loratadine), Allegra (fexofenadine), or Xyzal (levocetirizine) daily as needed. May take twice a day during allergy flares. May switch antihistamines every few months. Use Flonase (fluticasone) nasal spray 1 spray per nostril twice a day as needed for nasal congestion.  Continue Singulair (montelukast) 10mg  daily at night.  Other atopic dermatitis See below for proper skin care.  Return in about 2 months (around 04/19/2023) for Skin testing.  Meds ordered this encounter  Medications   budesonide-formoterol (SYMBICORT) 80-4.5 MCG/ACT inhaler    Sig: Inhale 2 puffs into the lungs in the morning and at bedtime. with spacer and rinse mouth afterwards.    Dispense:  1 each    Refill:  3   albuterol (VENTOLIN HFA) 108 (90 Base) MCG/ACT inhaler    Sig: Inhale 2 puffs into the lungs every 4 (four) hours as needed for wheezing or shortness of breath (coughing fits).    Dispense:  18 g    Refill:  1   predniSONE (DELTASONE) 10 MG tablet    Sig: Prednisone 10mg  tablets - take 2 tablets for 4 days then 1 tablet on day 5.    Dispense:  9 tablet    Refill:  0   Lab Orders  No laboratory test(s) ordered today    Other allergy screening: Food allergy: no Medication allergy: no Hymenoptera allergy: no Urticaria: no Eczema: yes - on arms. History of recurrent infections suggestive of immunodeficency: no  Diagnostics: Spirometry:  Tracings reviewed. Her effort: Good reproducible efforts. FVC: 3.88L FEV1: 2.19L, 79% predicted FEV1/FVC ratio: 56% Interpretation: Spirometry consistent with moderate obstructive disease with 40% improvement in FEV1 post bronchodilator treatment. Clinically feeling much improved.   Please see scanned spirometry results for details.  Skin Testing: unable to skin test today  due to poor asthma status.   Results discussed with patient/family.   Past Medical History: Patient Active Problem List   Diagnosis Date Noted   Asthma, not well controlled 02/17/2023   Other allergic rhinitis 02/17/2023   Other atopic dermatitis 02/17/2023   Normal labor 06/02/2022   Tinea corporis 04/04/2022   Cervical dysplasia 12/14/2021   Trichomonal vaginitis during pregnancy in first trimester 12/14/2021   BMI 31.0-31.9,adult 12/10/2021   Past Medical History:  Diagnosis Date   Anxiety    Asthma    Eczema    Vaginal Pap smear, abnormal    Past Surgical History: Past Surgical History:  Procedure Laterality Date   HERNIA REPAIR     MOLE REMOVAL     Medication List:  Current Outpatient Medications  Medication Sig Dispense Refill   albuterol (VENTOLIN HFA) 108 (90 Base) MCG/ACT inhaler Inhale 2 puffs into the lungs every 4 (four) hours as needed for wheezing or shortness of breath (coughing fits). 18 g 1   Blood Pressure Monitoring (BLOOD PRESSURE KIT) DEVI 1 kit by Does not apply route once a week. Check Blood Pressure regularly and record readings into the Bab[employer] App.  Large Cuff.  DX O90.0 1 each 0   budesonide-formoterol (SYMBICORT) 80-4.5 MCG/ACT inhaler Inhale 2 puffs into the lungs in the morning and at bedtime. with spacer and rinse mouth afterwards. 1 each 3   ferrous sulfate (FERROUSUL) 325 (65 FE) MG tablet Take 1 tablet (325 mg total) by mouth every other day. 60 tablet 1   fluticasone (  FLONASE) 50 MCG/ACT nasal spray two sprays by Both Nostrils route daily.     montelukast (SINGULAIR) 10 MG tablet Take 10 mg by mouth at bedtime.     predniSONE (DELTASONE) 10 MG tablet Prednisone 10mg  tablets - take 2 tablets for 4 days then 1 tablet on day 5. 9 tablet 0   cetirizine (ZYRTEC ALLERGY) 10 MG tablet Take 1 tablet (10 mg total) by mouth daily. 30 tablet 0   Prenatal Vit-Fe Fumarate-FA (PRENATAL VITAMINS PO) Take by mouth. (Patient not taking: Reported on  02/17/2023)     No current facility-administered medications for this visit.   Allergies: No Known Allergies Social History: Social History   Socioeconomic History   Marital status: Single    Spouse name: Not on file   Number of children: 1   Years of education: Not on file   Highest education level: Not on file  Occupational History   Not on file  Tobacco Use   Smoking status: Never    Passive exposure: Never   Smokeless tobacco: Never  Vaping Use   Vaping Use: Never used  Substance and Sexual Activity   Alcohol use: No   Drug use: Not Currently    Types: Marijuana   Sexual activity: Not Currently    Birth control/protection: I.U.D.  Other Topics Concern   Not on file  Social History Narrative   Not on file   Social Determinants of Health   Financial Resource Strain: Not on file  Food Insecurity: Not on file  Transportation Needs: Not on file  Physical Activity: Not on file  Stress: Not on file  Social Connections: Not on file   Lives in apartment. 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 History: Immunologist in the house: no Engineer, civil (consulting) in the family room: no Engineer, civil (consulting) in the bedroom: no Heating: electric Cooling: central Pet: no  Family History: Family History  Problem Relation Age of Onset   Allergic rhinitis Mother    Healthy Mother    Healthy Father    Allergic rhinitis Sister    Allergic rhinitis Sister    Alcohol abuse Neg Hx    Asthma Neg Hx    Arthritis Neg Hx    Birth defects Neg Hx    Cancer Neg Hx    COPD Neg Hx    Depression Neg Hx    Diabetes Neg Hx    Drug abuse Neg Hx    Early death Neg Hx    Hearing loss Neg Hx    Heart disease Neg Hx    Hyperlipidemia Neg Hx    Hypertension Neg Hx    Kidney disease Neg Hx    Learning disabilities Neg Hx    Mental illness Neg Hx    Mental retardation Neg Hx    Miscarriages / Stillbirths Neg Hx    Stroke Neg Hx    Vision loss Neg Hx     Varicose Veins Neg Hx    Review of Systems  Constitutional:  Negative for appetite change, chills, fever and unexpected weight change.  HENT:  Positive for congestion. Negative for rhinorrhea.   Eyes:  Positive for itching.  Respiratory:  Positive for cough, chest tightness, shortness of breath and wheezing.   Cardiovascular:  Negative for chest pain.  Gastrointestinal:  Negative for abdominal pain.  Genitourinary:  Negative for difficulty urinating.  Skin:  Negative for rash.  Neurological:  Positive for headaches.    Objective: BP 130/80   Pulse  83   Temp 98.4 F (36.9 C)   Resp 20   Ht 5' 4.57" (1.64 m)   Wt 206 lb 4.8 oz (93.6 kg)   SpO2 99%   BMI 34.79 kg/m  Body mass index is 34.79 kg/m. Physical Exam Vitals and nursing note reviewed.  Constitutional:      Appearance: Normal appearance. She is well-developed.  HENT:     Head: Normocephalic and atraumatic.     Right Ear: Tympanic membrane and external ear normal.     Left Ear: Tympanic membrane and external ear normal.     Nose: Nose normal.     Mouth/Throat:     Mouth: Mucous membranes are moist.     Pharynx: Oropharynx is clear.  Eyes:     Conjunctiva/sclera: Conjunctivae normal.  Cardiovascular:     Rate and Rhythm: Normal rate and regular rhythm.     Heart sounds: Normal heart sounds. No murmur heard.    No friction rub. No gallop.  Pulmonary:     Effort: Pulmonary effort is normal.     Breath sounds: Normal breath sounds. No wheezing, rhonchi or rales.  Musculoskeletal:     Cervical back: Neck supple.  Skin:    General: Skin is warm.     Findings: No rash.  Neurological:     Mental Status: She is alert and oriented to person, place, and time.  Psychiatric:        Behavior: Behavior normal.   The plan was reviewed with the patient/family, and all questions/concerned were addressed.  It was my pleasure to see Misty Curry today and participate in her care. Please feel free to contact me with any  questions or concerns.  Sincerely,  Wyline Mood, DO Allergy & Immunology  [facility] office: [phone] [facility] office: [phone]

## 2023-02-17 ENCOUNTER — Ambulatory Visit (INDEPENDENT_AMBULATORY_CARE_PROVIDER_SITE_OTHER): Payer: Medicaid Other | Admitting: Allergy

## 2023-02-17 ENCOUNTER — Encounter: Payer: Self-pay | Admitting: Allergy

## 2023-02-17 ENCOUNTER — Other Ambulatory Visit: Payer: Self-pay

## 2023-02-17 VITALS — BP 130/80 | HR 83 | Temp 98.4°F | Resp 20 | Ht 64.57 in | Wt 206.3 lb

## 2023-02-17 DIAGNOSIS — J4541 Moderate persistent asthma with (acute) exacerbation: Secondary | ICD-10-CM | POA: Diagnosis not present

## 2023-02-17 DIAGNOSIS — L2089 Other atopic dermatitis: Secondary | ICD-10-CM | POA: Diagnosis not present

## 2023-02-17 DIAGNOSIS — J3089 Other allergic rhinitis: Secondary | ICD-10-CM | POA: Diagnosis not present

## 2023-02-17 DIAGNOSIS — R21 Rash and other nonspecific skin eruption: Secondary | ICD-10-CM | POA: Insufficient documentation

## 2023-02-17 DIAGNOSIS — J45909 Unspecified asthma, uncomplicated: Secondary | ICD-10-CM | POA: Insufficient documentation

## 2023-02-17 MED ORDER — ALBUTEROL SULFATE HFA 108 (90 BASE) MCG/ACT IN AERS: 2.0000 | INHALATION_SPRAY | RESPIRATORY_TRACT | 1 refills | Status: DC | PRN

## 2023-02-17 MED ORDER — PREDNISONE 10 MG PO TABS: ORAL_TABLET | ORAL | 0 refills | Status: DC

## 2023-02-17 MED ORDER — BUDESONIDE-FORMOTEROL FUMARATE 80-4.5 MCG/ACT IN AERO
2.0000 | INHALATION_SPRAY | Freq: Two times a day (BID) | RESPIRATORY_TRACT | 3 refills | Status: DC
Start: 2023-02-17 — End: 2024-01-02

## 2023-02-17 NOTE — Assessment & Plan Note (Signed)
?   See below for proper skin care. ?

## 2023-02-17 NOTE — Assessment & Plan Note (Signed)
Diagnosed with asthma 2 years ago and having worsening symptoms at past 3 months requiring 2 courses of prednisone.  Currently has daily symptoms and sometimes uses albuterol up to 5 times per day with some benefit.  Triggers include allergies, infections and heat. Today's spirometry showed moderate obstruction with 40% improvement in FEV1 postbronchodilator treatment.  Clinically feeling much improved. Start prednisone taper. Prednisone 10mg  tablets - take 2 tablets for 4 days then 1 tablet on day 5.  Daily controller medication(s): START Symbicort 2 puffs twice a day with spacer and rinse mouth afterwards. Spacer given and demonstrated proper use with inhaler. Patient understood technique and all questions/concerned were addressed.  Continue Singulair (montelukast) 10mg  daily at night. May use albuterol rescue inhaler 2 puffs every 4 to 6 hours as needed for shortness of breath, chest tightness, coughing, and wheezing. May use albuterol rescue inhaler 2 puffs 5 to 15 minutes prior to strenuous physical activities. Monitor frequency of use.  Get spirometry at next visit.

## 2023-02-17 NOTE — Patient Instructions (Addendum)
Asthma Start prednisone taper. Prednisone 10mg  tablets - take 2 tablets for 4 days then 1 tablet on day 5.   Daily controller medication(s): START Symbicort 2 puffs twice a day with spacer and rinse mouth afterwards. Spacer given and demonstrated proper use with inhaler. Patient understood technique and all questions/concerned were addressed.  Continue Singulair (montelukast) 10mg  daily at night.  May use albuterol rescue inhaler 2 puffs every 4 to 6 hours as needed for shortness of breath, chest tightness, coughing, and wheezing. May use albuterol rescue inhaler 2 puffs 5 to 15 minutes prior to strenuous physical activities. Monitor frequency of use.  Breathing control goals:  Full participation in all desired activities (may need albuterol before activity) Albuterol use two times or less a week on average (not counting use with activity) Cough interfering with sleep two times or less a month Oral steroids no more than once a year No hospitalizations   Environmental allergies Unable to skin test today due to poor respiratory status. Will plan on skin testing at next visit. See below for environmental control measures. Use over the counter antihistamines such as Zyrtec (cetirizine), Claritin (loratadine), Allegra (fexofenadine), or Xyzal (levocetirizine) daily as needed. May take twice a day during allergy flares. May switch antihistamines every few months. Use Flonase (fluticasone) nasal spray 1 spray per nostril twice a day as needed for nasal congestion.  Continue Singulair (montelukast) 10mg  daily at night.  Skin See below for proper skin care.  Follow up in 2 months for check up and skin testing. No antihistamines for 3 days before. Okay to take inhaler and Singulair.   Reducing Pollen Exposure Pollen seasons: trees (spring), grass (summer) and ragweed/weeds (fall). Keep windows closed in your home and car to lower pollen exposure.  Install air conditioning in the bedroom  and throughout the house if possible.  Avoid going out in dry windy days - especially early morning. Pollen counts are highest between 5 - 10 AM and on dry, hot and windy days.  Save outside activities for late afternoon or after a heavy rain, when pollen levels are lower.  Avoid mowing of grass if you have grass pollen allergy. Be aware that pollen can also be transported indoors on people and pets.  Dry your clothes in an automatic dryer rather than hanging them outside where they might collect pollen.  Rinse hair and eyes before bedtime.  Skin care recommendations  Bath time: Always use lukewarm water. AVOID very hot or cold water. Keep bathing time to 5-10 minutes. Do NOT use bubble bath. Use a mild soap and use just enough to wash the dirty areas. Do NOT scrub skin vigorously.  After bathing, pat dry your skin with a towel. Do NOT rub or scrub the skin.  Moisturizers and prescriptions:  ALWAYS apply moisturizers immediately after bathing (within 3 minutes). This helps to lock-in moisture. Use the moisturizer several times a day over the whole body. Good summer moisturizers include: Aveeno, CeraVe, Cetaphil. Good winter moisturizers include: Aquaphor, Vaseline, Cerave, Cetaphil, Eucerin, Vanicream. When using moisturizers along with medications, the moisturizer should be applied about one hour after applying the medication to prevent diluting effect of the medication or moisturize around where you applied the medications. When not using medications, the moisturizer can be continued twice daily as maintenance.  Laundry and clothing: Avoid laundry products with added color or perfumes. Use unscented hypo-allergenic laundry products such as Tide free, Cheer free & gentle, and All free and clear.  If the skin  still seems dry or sensitive, you can try double-rinsing the clothes. Avoid tight or scratchy clothing such as wool. Do not use fabric softeners or dyer sheets.

## 2023-02-17 NOTE — Assessment & Plan Note (Signed)
Rhinoconjunctivitis symptoms mainly in the spring and summer.  Tried Zyrtec, Singulair and Flonase with some benefit.  No prior allergy evaluation. Unable to skin test today due to poor respiratory status. Will plan on skin testing at next visit. See below for environmental control measures for pollen. Use over the counter antihistamines such as Zyrtec (cetirizine), Claritin (loratadine), Allegra (fexofenadine), or Xyzal (levocetirizine) daily as needed. May take twice a day during allergy flares. May switch antihistamines every few months. Use Flonase (fluticasone) nasal spray 1 spray per nostril twice a day as needed for nasal congestion.  Continue Singulair (montelukast) 10mg  daily at night.

## 2023-02-18 ENCOUNTER — Other Ambulatory Visit (HOSPITAL_COMMUNITY): Payer: Self-pay

## 2023-03-03 ENCOUNTER — Emergency Department (HOSPITAL_BASED_OUTPATIENT_CLINIC_OR_DEPARTMENT_OTHER)
Admission: EM | Admit: 2023-03-03 | Discharge: 2023-03-03 | Disposition: A | Discharge status: Home / Self Care | Payer: Medicaid Other | Attending: Emergency Medicine | Admitting: Emergency Medicine

## 2023-03-03 ENCOUNTER — Encounter (HOSPITAL_BASED_OUTPATIENT_CLINIC_OR_DEPARTMENT_OTHER): Payer: Self-pay | Admitting: Emergency Medicine

## 2023-03-03 ENCOUNTER — Other Ambulatory Visit: Payer: Self-pay

## 2023-03-03 ENCOUNTER — Emergency Department (HOSPITAL_BASED_OUTPATIENT_CLINIC_OR_DEPARTMENT_OTHER): Payer: Medicaid Other | Admitting: Radiology

## 2023-03-03 DIAGNOSIS — R0789 Other chest pain: Secondary | ICD-10-CM | POA: Diagnosis not present

## 2023-03-03 DIAGNOSIS — J45909 Unspecified asthma, uncomplicated: Secondary | ICD-10-CM | POA: Insufficient documentation

## 2023-03-03 DIAGNOSIS — R079 Chest pain, unspecified: Secondary | ICD-10-CM | POA: Diagnosis not present

## 2023-03-03 LAB — BASIC METABOLIC PANEL
Anion gap: 8 (ref 5–15)
BUN: 10 mg/dL (ref 6–20)
CO2: 25 mmol/L (ref 22–32)
Calcium: 8.2 mg/dL — ABNORMAL LOW (ref 8.9–10.3)
Chloride: 104 mmol/L (ref 98–111)
Creatinine, Ser: 0.74 mg/dL (ref 0.44–1.00)
GFR, Estimated: >60 mL/min (ref >60)
Glucose, Bld: 88 mg/dL (ref 70–99)
Potassium: 4 mmol/L (ref 3.5–5.1)
Sodium: 137 mmol/L (ref 135–145)

## 2023-03-03 LAB — CBC
HCT: 38.9 % (ref 36.0–46.0)
Hemoglobin: 12.2 g/dL (ref 12.0–15.0)
MCH: 27.6 pg (ref 26.0–34.0)
MCHC: 31.4 g/dL (ref 30.0–36.0)
MCV: 88 fL (ref 80.0–100.0)
Platelets: 329 10*3/uL (ref 150–400)
RBC: 4.42 MIL/uL (ref 3.87–5.11)
RDW: 14.2 % (ref 11.5–15.5)
WBC: 7.6 10*3/uL (ref 4.0–10.5)
nRBC: 0 % (ref 0.0–0.2)

## 2023-03-03 LAB — TROPONIN I (HIGH SENSITIVITY): Troponin I (High Sensitivity): <2 ng/L (ref <18)

## 2023-03-03 NOTE — ED Triage Notes (Signed)
Pt arrives pov, steady gait with c/o "asthma attack" on x 4 days pta. Reports having sharp bilateral CP, worse on LT side under breast. Reports bilateral "rib pain" today. Using inhaler twice daily

## 2023-03-03 NOTE — ED Notes (Signed)
Patient transported to X-ray 

## 2023-03-03 NOTE — ED Provider Notes (Signed)
[place] EMERGENCY DEPARTMENT AT [facility] Provider Note   CSN: [id number] Arrival date & time: 03/03/23  1256     History  Chief Complaint  Patient presents with   Chest Pain    Misty Curry is a 32 y.o. female.   Chest Pain Patient presents with left anterior chest pain.  Began on Friday with today being Monday.  States she woke up short of breath and getting up she began to feel sharp left anterior lower chest pain.  Worse with certain movements.  Worse with palpation.  States used inhaler and breathing is improved, however still pain on the chest.  No swelling in her legs.  Rare cough.  Does have history of asthma and states that heat sometimes makes it worse.    Past Medical History:  Diagnosis Date   Anxiety    Asthma    Eczema    Vaginal Pap smear, abnormal     Home Medications Prior to Admission medications   Medication Sig Start Date End Date Taking? Authorizing Provider  albuterol (VENTOLIN HFA) 108 (90 Base) MCG/ACT inhaler Inhale 2 puffs into the lungs every 4 (four) hours as needed for wheezing or shortness of breath (coughing fits). 02/17/23   Ellamae Sia, DO  Blood Pressure Monitoring (BLOOD PRESSURE KIT) DEVI 1 kit by Does not apply route once a week. Check Blood Pressure regularly and record readings into the [employer] App.  Large Cuff.  DX O90.0 12/07/21   Brock Bad, MD  budesonide-formoterol [facility]) 80-4.5 MCG/ACT inhaler Inhale 2 puffs into the lungs in the morning and at bedtime. with spacer and rinse mouth afterwards. 02/17/23   Ellamae Sia, DO  cetirizine (ZYRTEC ALLERGY) 10 MG tablet Take 1 tablet (10 mg total) by mouth daily. 01/02/23 02/01/23  Sloan Leiter, DO  ferrous sulfate (FERROUSUL) 325 (65 FE) MG tablet Take 1 tablet (325 mg total) by mouth every other day. 05/02/22   Constant, Peggy, MD  fluticasone (FLONASE) 50 MCG/ACT nasal spray two sprays by Both Nostrils route daily. 01/27/23   [provider]   montelukast (SINGULAIR) 10 MG tablet Take 10 mg by mouth at bedtime.    [provider]  predniSONE (DELTASONE) 10 MG tablet Prednisone 10mg  tablets - take 2 tablets for 4 days then 1 tablet on day 5. 02/17/23   Ellamae Sia, DO  Prenatal Vit-Fe Fumarate-FA (PRENATAL VITAMINS PO) Take by mouth. Patient not taking: Reported on 02/17/2023    [provider]  dicyclomine (BENTYL) 20 MG tablet Take 1 tablet (20 mg total) by mouth 2 (two) times daily. 12/05/14 07/04/20  Garlon Hatchet, PA-C  Ferrous Fumarate (IRON) 18 MG TBCR Take 1 tablet (18 mg total) by mouth every morning. Patient not taking: Reported on 12/05/2014 01/01/14 07/04/20  Antionette Char, MD  metoCLOPramide (REGLAN) 10 MG tablet Take 1 tablet (10 mg total) by mouth 3 (three) times daily as needed for nausea (headache / nausea). 12/05/14 07/04/20  Garlon Hatchet, PA-C      Allergies    Sertraline    Review of Systems   Review of Systems  Cardiovascular:  Positive for chest pain.    Physical Exam Updated Vital Signs BP (!) 148/96 (BP Location: Right Arm)   Pulse 77   Temp 97.9 F (36.6 C)   Resp 16   Wt 93 kg   LMP 02/24/2023   SpO2 100%   BMI 34.57 kg/m  Physical Exam Vitals and nursing note reviewed.  Cardiovascular:     Rate and Rhythm: Regular rhythm.     Heart sounds: No murmur heard. Pulmonary:     Breath sounds: No wheezing or rhonchi.  Chest:     Chest wall: Tenderness present.     Comments: Tenderness to left anterior lower chest wall.  No crepitus or deformity.  No rash.  No left upper quadrant abdominal tenderness. Musculoskeletal:     Right lower leg: No edema.     Left lower leg: No edema.  Neurological:     Mental Status: She is alert.     ED Results / Procedures / Treatments   Labs (all labs ordered are listed, but only abnormal results are displayed) Labs Reviewed  BASIC METABOLIC PANEL - Abnormal; Notable for the following components:      Result Value   Calcium 8.2 (*)     All other components within normal limits  CBC  TROPONIN I (HIGH SENSITIVITY)    EKG EKG Interpretation  Date/Time:  Monday Mar 03 2023 13:06:16 EDT Ventricular Rate:  91 PR Interval:  142 QRS Duration: 78 QT Interval:  398 QTC Calculation: 489 R Axis:   84 Text Interpretation: Normal sinus rhythm Nonspecific T wave abnormality Prolonged QT Abnormal ECG When compared with ECG of 20-Jul-2018 20:24, Inverted T waves have replaced nonspecific T wave abnormality in Inferior leads Nonspecific T wave abnormality now evident in Anterior leads Confirmed by Benjiman Core [phone]) on 03/03/2023 1:39:32 PM  Radiology DG Chest 2 View  Result Date: 03/03/2023 CLINICAL DATA:  Chest pain EXAM: CHEST - 2 VIEW COMPARISON:  CXR 07/20/18 FINDINGS: The heart size and mediastinal contours are within normal limits. Both lungs are clear. The visualized skeletal structures are unremarkable. IMPRESSION: No active cardiopulmonary disease. Electronically Signed   By: Lorenza Cambridge M.D.   On: 03/03/2023 13:34    Procedures Procedures    Medications Ordered in ED Medications - No data to display  ED Course/ Medical Decision Making/ A&P                             Medical Decision Making Amount and/or Complexity of Data Reviewed Labs: ordered. Radiology: ordered.   Patient with sharp left anterior lower chest pain.  Began acutely.  Differential diagnosis includes nonspecific chest pain, chest wall pain, pneumonia, pneumothorax.  Pulm embolism felt less likely.  Chest x-ray done reassuring shows no infiltrate or pneumothorax.  No rash.  I think most likely musculoskeletal pain.  Will get basic blood work and likely discharge home.  Blood work reassuring.  Doubt cardiac ischemia.  Discussed with patient other potential causes such as early zoster but I think most likely chest wall pain.  Appears stable for discharge home.        Final Clinical Impression(s) / ED Diagnoses Final diagnoses:  Chest  wall pain    Rx / DC Orders ED Discharge Orders     None         Benjiman Core, MD 03/03/23 1436

## 2023-05-18 NOTE — Progress Notes (Deleted)
Follow Up Note  RE: Misty Curry MRN: [id number] DOB: 1991-09-10 Date of Office Visit: 05/19/2023  Referring provider: [facility] Primary care provider: [facility]  Chief Complaint: No chief complaint on file.  History of Present Illness: I had the pleasure of seeing Misty Curry for [facility]  on [facility]/18/2023. She is a 32 y.o. female, who is being followed for asthma, allergic rhinoconjunctivitis and atopic dermatitis. Her previous allergy office visit was on 02/17/2023 with Dr. Selena Batten. Today is a regular follow up visit.  Asthma Diagnosed with asthma 2 years ago and having worsening symptoms at past 3 months requiring 2 courses of prednisone.  Currently has daily symptoms and sometimes uses albuterol up to 5 times per day with some benefit.  Triggers include allergies, infections and heat. Today's spirometry showed moderate obstruction with 40% improvement in FEV1 postbronchodilator treatment.  Clinically feeling much improved. Start prednisone taper. Prednisone 10mg  tablets - take 2 tablets for 4 days then 1 tablet on day 5.  Daily controller medication(s): START Symbicort 2 puffs twice a day with spacer and rinse mouth afterwards. Spacer given and demonstrated proper use with inhaler. Patient understood technique and all questions/concerned were addressed.  Continue Singulair (montelukast) 10mg  daily at night. May use albuterol rescue inhaler 2 puffs every 4 to 6 hours as needed for shortness of breath, chest tightness, coughing, and wheezing. May use albuterol rescue inhaler 2 puffs 5 to 15 minutes prior to strenuous physical activities. Monitor frequency of use.  Get spirometry at next visit.   Allergic rhinitis Rhinoconjunctivitis symptoms mainly in the spring and summer.  Tried Zyrtec, Singulair and Flonase with some benefit.  No prior allergy evaluation. Unable to skin test  today due to poor respiratory status. Will plan on skin testing at next visit. See below for environmental control measures for pollen. Use over the counter antihistamines such as Zyrtec (cetirizine), Claritin (loratadine), Allegra (fexofenadine), or Xyzal (levocetirizine) daily as needed. May take twice a day during allergy flares. May switch antihistamines every few months. Use Flonase (fluticasone) nasal spray 1 spray per nostril twice a day as needed for nasal congestion.  Continue Singulair (montelukast) 10mg  daily at night.   Other atopic dermatitis See below for proper skin care.    Assessment and Plan: Misty is a 32 y.o. female with: No problem-specific Assessment & Plan notes found for this encounter.  No follow-ups on file.  No orders of the defined types were placed in this encounter.  Lab Orders  No laboratory test(s) ordered today    Diagnostics: Spirometry:  Tracings reviewed. Her effort: {Blank single:19197::"Good reproducible efforts.","It was hard to get consistent efforts and there is a question as to whether this reflects a maximal maneuver.","Poor effort, data can not be interpreted."} FVC: ***L FEV1: ***L, ***% predicted FEV1/FVC ratio: ***% Interpretation: {Blank single:19197::"Spirometry consistent with mild obstructive disease","Spirometry consistent with moderate obstructive disease","Spirometry consistent with severe obstructive disease","Spirometry consistent with possible restrictive disease","Spirometry consistent with mixed obstructive and restrictive disease","Spirometry uninterpretable due to technique","Spirometry consistent with normal pattern","No overt abnormalities noted given today's efforts"}.  Please see scanned spirometry results for details.  Skin Testing: {Blank single:19197::"Select foods","Environmental allergy panel","Environmental allergy panel and select foods","Food allergy panel","None","Deferred due to recent antihistamines  use"}. *** Results discussed with patient/family.   Medication List:  Current Outpatient Medications  Medication Sig Dispense Refill   albuterol (VENTOLIN HFA) 108 (90 Base) MCG/ACT inhaler Inhale 2 puffs  into the lungs every 4 (four) hours as needed for wheezing or shortness of breath (coughing fits). 18 g 1   Blood Pressure Monitoring (BLOOD PRESSURE KIT) DEVI 1 kit by Does not apply route once a week. Check Blood Pressure regularly and record readings into the [employer] App.  Large Cuff.  DX O90.0 1 each 0   budesonide-formoterol (SYMBICORT) 80-4.5 MCG/ACT inhaler Inhale 2 puffs into the lungs in the morning and at bedtime. with spacer and rinse mouth afterwards. 1 each 3   cetirizine (ZYRTEC ALLERGY) 10 MG tablet Take 1 tablet (10 mg total) by mouth daily. 30 tablet 0   ferrous sulfate (FERROUSUL) 325 (65 FE) MG tablet Take 1 tablet (325 mg total) by mouth every other day. 60 tablet 1   fluticasone (FLONASE) 50 MCG/ACT nasal spray two sprays by Both Nostrils route daily.     montelukast (SINGULAIR) 10 MG tablet Take 10 mg by mouth at bedtime.     predniSONE (DELTASONE) 10 MG tablet Prednisone 10mg  tablets - take 2 tablets for 4 days then 1 tablet on day 5. 9 tablet 0   Prenatal Vit-Fe Fumarate-FA (PRENATAL VITAMINS PO) Take by mouth. (Patient not taking: Reported on 02/17/2023)     No current facility-administered medications for this visit.   Allergies: Allergies  Allergen Reactions   Sertraline Other (See Comments)    somulence   I reviewed her past medical history, social history, family history, and environmental history and no significant changes have been reported from her previous visit.  Review of Systems  Constitutional:  Negative for appetite change, chills, fever and unexpected weight change.  HENT:  Positive for congestion. Negative for rhinorrhea.   Eyes:  Positive for itching.  Respiratory:  Positive for cough, chest tightness, shortness of breath and wheezing.    Cardiovascular:  Negative for chest pain.  Gastrointestinal:  Negative for abdominal pain.  Genitourinary:  Negative for difficulty urinating.  Skin:  Negative for rash.  Neurological:  Positive for headaches.    Objective: There were no vitals taken for this visit. There is no height or weight on file to calculate BMI. Physical Exam Vitals and nursing note reviewed.  Constitutional:      Appearance: Normal appearance. She is well-developed.  HENT:     Head: Normocephalic and atraumatic.     Right Ear: Tympanic membrane and external ear normal.     Left Ear: Tympanic membrane and external ear normal.     Nose: Nose normal.     Mouth/Throat:     Mouth: Mucous membranes are moist.     Pharynx: Oropharynx is clear.  Eyes:     Conjunctiva/sclera: Conjunctivae normal.  Cardiovascular:     Rate and Rhythm: Normal rate and regular rhythm.     Heart sounds: Normal heart sounds. No murmur heard.    No friction rub. No gallop.  Pulmonary:     Effort: Pulmonary effort is normal.     Breath sounds: Normal breath sounds. No wheezing, rhonchi or rales.  Musculoskeletal:     Cervical back: Neck supple.  Skin:    General: Skin is warm.     Findings: No rash.  Neurological:     Mental Status: She is alert and oriented to person, place, and time.  Psychiatric:        Behavior: Behavior normal.    Previous notes and tests were reviewed. The plan was reviewed with the patient/family, and all questions/concerned were addressed.  It was my pleasure to see  Misty today and participate in her care. Please feel free to contact me with any questions or concerns.  Sincerely,  Wyline Mood, DO Allergy & Immunology  [facility] office: [phone] [facility] office: [phone]

## 2023-05-19 ENCOUNTER — Ambulatory Visit: Payer: Medicaid Other | Admitting: Allergy

## 2023-05-19 DIAGNOSIS — J3089 Other allergic rhinitis: Secondary | ICD-10-CM

## 2023-05-19 DIAGNOSIS — L2089 Other atopic dermatitis: Secondary | ICD-10-CM

## 2023-10-20 DIAGNOSIS — H52223 Regular astigmatism, bilateral: Secondary | ICD-10-CM | POA: Diagnosis not present

## 2024-01-01 NOTE — Progress Notes (Unsigned)
 Follow Up Note  RE: ANELISE STARON MRN: [id number] DOB: Jul 29, 1991 Date of Office Visit: 01/02/2024  Referring provider: [facility] Primary care provider: [facility]  Chief Complaint: No chief complaint on file.  History of Present Illness: I had the pleasure of seeing Shakira Los for a follow up visit at the [facility] on 01/01/2024. She is a 33 y.o. female, who is being followed for asthma, allergic rhinitis, AD. Her previous allergy office visit was on 02/17/2023 with Dr. Selena Batten. Today is a regular follow up visit.  Discussed the use of AI scribe software for clinical note transcription with the patient, who gave verbal consent to proceed.  History of Present Illness            ***  Assessment and Plan: Lateisha is a 33 y.o. female with: Asthma, not well controlled Diagnosed with asthma 2 years ago and having worsening symptoms at past 3 months requiring 2 courses of prednisone.  Currently has daily symptoms and sometimes uses albuterol up to 5 times per day with some benefit.  Triggers include allergies, infections and heat. Today's spirometry showed moderate obstruction with 40% improvement in FEV1 postbronchodilator treatment.  Clinically feeling much improved. Start prednisone taper. Prednisone 10mg  tablets - take 2 tablets for 4 days then 1 tablet on day 5.  Daily controller medication(s): START Symbicort 2 puffs twice a day with spacer and rinse mouth afterwards. Spacer given and demonstrated proper use with inhaler. Patient understood technique and all questions/concerned were addressed.  Continue Singulair (montelukast) 10mg  daily at night. May use albuterol rescue inhaler 2 puffs every 4 to 6 hours as needed for shortness of breath, chest tightness, coughing, and wheezing. May use albuterol rescue inhaler 2 puffs 5 to 15 minutes prior to strenuous physical activities. Monitor frequency of  use.  Get spirometry at next visit.   Other allergic rhinitis Rhinoconjunctivitis symptoms mainly in the spring and summer.  Tried Zyrtec, Singulair and Flonase with some benefit.  No prior allergy evaluation. Unable to skin test today due to poor respiratory status. Will plan on skin testing at next visit. See below for environmental control measures for pollen. Use over the counter antihistamines such as Zyrtec (cetirizine), Claritin (loratadine), Allegra (fexofenadine), or Xyzal (levocetirizine) daily as needed. May take twice a day during allergy flares. May switch antihistamines every few months. Use Flonase (fluticasone) nasal spray 1 spray per nostril twice a day as needed for nasal congestion.  Continue Singulair (montelukast) 10mg  daily at night.   Other atopic dermatitis See below for proper skin care. Assessment and Plan              No follow-ups on file.  No orders of the defined types were placed in this encounter.  Lab Orders  No laboratory test(s) ordered today    Diagnostics: Spirometry:  Tracings reviewed. Her effort: {Blank single:19197::"Good reproducible efforts.","It was hard to get consistent efforts and there is a question as to whether this reflects a maximal maneuver.","Poor effort, data can not be interpreted."} FVC: ***L FEV1: ***L, ***% predicted FEV1/FVC ratio: ***% Interpretation: {Blank single:19197::"Spirometry consistent with mild obstructive disease","Spirometry consistent with moderate obstructive disease","Spirometry consistent with severe obstructive disease","Spirometry consistent with possible restrictive disease","Spirometry consistent with mixed obstructive and restrictive disease","Spirometry uninterpretable due to technique","Spirometry consistent with normal pattern","No overt abnormalities noted given today's efforts"}.  Please see scanned spirometry results for details.  Skin Testing: {Blank single:19197::"Select  foods","Environmental allergy panel","Environmental allergy  panel and select foods","Food allergy panel","None","Deferred due to recent antihistamines use"}. *** Results discussed with patient/family.   Medication List:  Current Outpatient Medications  Medication Sig Dispense Refill   albuterol (VENTOLIN HFA) 108 (90 Base) MCG/ACT inhaler Inhale 2 puffs into the lungs every 4 (four) hours as needed for wheezing or shortness of breath (coughing fits). 18 g 1   Blood Pressure Monitoring (BLOOD PRESSURE KIT) DEVI 1 kit by Does not apply route once a week. Check Blood Pressure regularly and record readings into the Babys[employer] App.  Large Cuff.  DX O90.0 1 each 0   budesonide-formoterol (SYMBICORT) 80-4.5 MCG/ACT inhaler Inhale 2 puffs into the lungs in the morning and at bedtime. with spacer and rinse mouth afterwards. 1 each 3   cetirizine (ZYRTEC ALLERGY) 10 MG tablet Take 1 tablet (10 mg total) by mouth daily. 30 tablet 0   ferrous sulfate (FERROUSUL) 325 (65 FE) MG tablet Take 1 tablet (325 mg total) by mouth every other day. 60 tablet 1   fluticasone (FLONASE) 50 MCG/ACT nasal spray two sprays by Both Nostrils route daily.     montelukast (SINGULAIR) 10 MG tablet Take 10 mg by mouth at bedtime.     predniSONE (DELTASONE) 10 MG tablet Prednisone 10mg  tablets - take 2 tablets for 4 days then 1 tablet on day 5. 9 tablet 0   Prenatal Vit-Fe Fumarate-FA (PRENATAL VITAMINS PO) Take by mouth. (Patient not taking: Reported on 02/17/2023)     No current facility-administered medications for this visit.   Allergies: Allergies  Allergen Reactions   Sertraline Other (See Comments)    somulence   I reviewed her past medical history, social history, family history, and environmental history and no significant changes have been reported from her previous visit.  Review of Systems  Constitutional:  Negative for appetite change, chills, fever and unexpected weight change.  HENT:  Positive for  congestion. Negative for rhinorrhea.   Eyes:  Positive for itching.  Respiratory:  Positive for cough, chest tightness, shortness of breath and wheezing.   Cardiovascular:  Negative for chest pain.  Gastrointestinal:  Negative for abdominal pain.  Genitourinary:  Negative for difficulty urinating.  Skin:  Negative for rash.  Neurological:  Positive for headaches.    Objective: There were no vitals taken for this visit. There is no height or weight on file to calculate BMI. Physical Exam Vitals and nursing note reviewed.  Constitutional:      Appearance: Normal appearance. She is well-developed.  HENT:     Head: Normocephalic and atraumatic.     Right Ear: Tympanic membrane and external ear normal.     Left Ear: Tympanic membrane and external ear normal.     Nose: Nose normal.     Mouth/Throat:     Mouth: Mucous membranes are moist.     Pharynx: Oropharynx is clear.  Eyes:     Conjunctiva/sclera: Conjunctivae normal.  Cardiovascular:     Rate and Rhythm: Normal rate and regular rhythm.     Heart sounds: Normal heart sounds. No murmur heard.    No friction rub. No gallop.  Pulmonary:     Effort: Pulmonary effort is normal.     Breath sounds: Normal breath sounds. No wheezing, rhonchi or rales.  Musculoskeletal:     Cervical back: Neck supple.  Skin:    General: Skin is warm.     Findings: No rash.  Neurological:     Mental Status: She is alert and oriented to person, place,  and time.  Psychiatric:        Behavior: Behavior normal.    Previous notes and tests were reviewed. The plan was reviewed with the patient/family, and all questions/concerned were addressed.  It was my pleasure to see [place] today and participate in her care. Please feel free to contact me with any questions or concerns.  Sincerely,  Wyline Mood, DO Allergy & Immunology  [facility] office: [phone] [facility] office: [phone]

## 2024-01-02 ENCOUNTER — Encounter: Payer: Self-pay | Admitting: Allergy

## 2024-01-02 ENCOUNTER — Other Ambulatory Visit: Payer: Self-pay

## 2024-01-02 ENCOUNTER — Ambulatory Visit (INDEPENDENT_AMBULATORY_CARE_PROVIDER_SITE_OTHER): Admitting: Allergy

## 2024-01-02 VITALS — BP 118/86 | HR 88 | Temp 98.1°F | Resp 16 | Ht 64.76 in | Wt 216.2 lb

## 2024-01-02 DIAGNOSIS — L2089 Other atopic dermatitis: Secondary | ICD-10-CM

## 2024-01-02 DIAGNOSIS — J3089 Other allergic rhinitis: Secondary | ICD-10-CM

## 2024-01-02 DIAGNOSIS — J4540 Moderate persistent asthma, uncomplicated: Secondary | ICD-10-CM

## 2024-01-02 MED ORDER — FLUTICASONE PROPIONATE 50 MCG/ACT NA SUSP: 1.0000 | Freq: Every day | NASAL | 5 refills | Status: AC | PRN

## 2024-01-02 MED ORDER — BUDESONIDE-FORMOTEROL FUMARATE 80-4.5 MCG/ACT IN AERO: 2.0000 | INHALATION_SPRAY | Freq: Two times a day (BID) | RESPIRATORY_TRACT | 5 refills | Status: AC

## 2024-01-02 MED ORDER — ALBUTEROL SULFATE HFA 108 (90 BASE) MCG/ACT IN AERS: 2.0000 | INHALATION_SPRAY | RESPIRATORY_TRACT | 1 refills | Status: DC | PRN

## 2024-01-02 NOTE — Patient Instructions (Addendum)
 Asthma Your breathing test looked great today. Daily controller medication(s): Symbicort 2 puffs twice a day with spacer and rinse mouth afterwards. May use albuterol rescue inhaler 2 puffs every 4 to 6 hours as needed for shortness of breath, chest tightness, coughing, and wheezing. May use albuterol rescue inhaler 2 puffs 5 to 15 minutes prior to strenuous physical activities. Monitor frequency of use - if you need to use it more than twice per week on a consistent basis let us know.  Breathing control goals:  Full participation in all desired activities (may need albuterol before activity) Albuterol use two times or less a week on average (not counting use with activity) Cough interfering with sleep two times or less a month Oral steroids no more than once a year No hospitalizations   Environmental allergies Use over the counter antihistamines such as Zyrtec (cetirizine), Claritin (loratadine), Allegra (fexofenadine), or Xyzal (levocetirizine) daily as needed. May take twice a day during allergy flares. May switch antihistamines every few months. Use Flonase (fluticasone) nasal spray 1 spray per nostril twice a day as needed for nasal congestion.   Follow up in 6 months or sooner if needed.   Reducing Pollen Exposure Pollen seasons: trees (spring), grass (summer) and ragweed/weeds (fall). Keep windows closed in your home and car to lower pollen exposure.  Install air conditioning in the bedroom and throughout the house if possible.  Avoid going out in dry windy days - especially early morning. Pollen counts are highest between 5 - 10 AM and on dry, hot and windy days.  Save outside activities for late afternoon or after a heavy rain, when pollen levels are lower.  Avoid mowing of grass if you have grass pollen allergy. Be aware that pollen can also be transported indoors on people and pets.  Dry your clothes in an automatic dryer rather than hanging them outside where they might  collect pollen.  Rinse hair and eyes before bedtime.  Skin care recommendations  Bath time: Always use lukewarm water. AVOID very hot or cold water. Keep bathing time to 5-10 minutes. Do NOT use bubble bath. Use a mild soap and use just enough to wash the dirty areas. Do NOT scrub skin vigorously.  After bathing, pat dry your skin with a towel. Do NOT rub or scrub the skin.  Moisturizers and prescriptions:  ALWAYS apply moisturizers immediately after bathing (within 3 minutes). This helps to lock-in moisture. Use the moisturizer several times a day over the whole body. Good summer moisturizers include: Aveeno, CeraVe, Cetaphil. Good winter moisturizers include: Aquaphor, Vaseline, Cerave, Cetaphil, Eucerin, Vanicream. When using moisturizers along with medications, the moisturizer should be applied about one hour after applying the medication to prevent diluting effect of the medication or moisturize around where you applied the medications. When not using medications, the moisturizer can be continued twice daily as maintenance.  Laundry and clothing: Avoid laundry products with added color or perfumes. Use unscented hypo-allergenic laundry products such as Tide free, Cheer free & gentle, and All free and clear.  If the skin still seems dry or sensitive, you can try double-rinsing the clothes. Avoid tight or scratchy clothing such as wool. Do not use fabric softeners or dyer sheets.

## 2024-03-03 ENCOUNTER — Other Ambulatory Visit: Payer: Self-pay | Admitting: Allergy

## 2024-05-20 DIAGNOSIS — H5213 Myopia, bilateral: Secondary | ICD-10-CM | POA: Diagnosis not present

## 2024-07-05 ENCOUNTER — Other Ambulatory Visit: Payer: Self-pay

## 2024-07-05 ENCOUNTER — Encounter: Payer: Self-pay | Admitting: Allergy

## 2024-07-05 ENCOUNTER — Ambulatory Visit (INDEPENDENT_AMBULATORY_CARE_PROVIDER_SITE_OTHER): Admitting: Allergy

## 2024-07-05 VITALS — BP 116/78 | HR 86 | Temp 97.8°F | Resp 16 | Ht 66.25 in | Wt 217.2 lb

## 2024-07-05 DIAGNOSIS — J3089 Other allergic rhinitis: Secondary | ICD-10-CM | POA: Diagnosis not present

## 2024-07-05 DIAGNOSIS — J4540 Moderate persistent asthma, uncomplicated: Secondary | ICD-10-CM | POA: Diagnosis not present

## 2024-07-05 MED ORDER — BUDESONIDE-FORMOTEROL FUMARATE 160-4.5 MCG/ACT IN AERO: 2.0000 | INHALATION_SPRAY | Freq: Two times a day (BID) | RESPIRATORY_TRACT | 5 refills | Status: AC

## 2024-07-05 NOTE — Patient Instructions (Addendum)
 Asthma Your breathing test was normal but not as good as the last one.  Daily controller medication(s): Increase Symbicort  to 160mcg 2 puffs twice a day with spacer and rinse mouth afterwards for 1 month and then go back down to  Symbicort  80mcg 2 puffs twice a day as before.  During respiratory infections/flares:  Start Symbicort  160mcg 2 puffs twice a day for 1-2 weeks until your breathing symptoms return to baseline.  May use albuterol  rescue inhaler 2 puffs every 4 to 6 hours as needed for shortness of breath, chest tightness, coughing, and wheezing. May use albuterol  rescue inhaler 2 puffs 5 to 15 minutes prior to strenuous physical activities. Monitor frequency of use - if you need to use it more than twice per week on a consistent basis let us  know.  Breathing control goals:  Full participation in all desired activities (may need albuterol  before activity) Albuterol  use two times or less a week on average (not counting use with activity) Cough interfering with sleep two times or less a month Oral steroids no more than once a year No hospitalizations   Environmental allergies Use over the counter antihistamines such as Zyrtec  (cetirizine ), Claritin (loratadine), Allegra (fexofenadine), or Xyzal (levocetirizine) daily as needed. May take twice a day during allergy flares. May switch antihistamines every few months. Use Flonase  (fluticasone ) nasal spray 1-2 sprays per nostril once a day as needed for nasal congestion.  Nasal saline spray (i.e., Simply Saline) or nasal saline lavage (i.e., NeilMed) is recommended as needed and prior to medicated nasal sprays.  Follow up in 6 months or sooner if needed.   Reducing Pollen Exposure Pollen seasons: trees (spring), grass (summer) and ragweed/weeds (fall). Keep windows closed in your home and car to lower pollen exposure.  Install air conditioning in the bedroom and throughout the house if possible.  Avoid going out in dry windy days -  especially early morning. Pollen counts are highest between 5 - 10 AM and on dry, hot and windy days.  Save outside activities for late afternoon or after a heavy rain, when pollen levels are lower.  Avoid mowing of grass if you have grass pollen allergy. Be aware that pollen can also be transported indoors on people and pets.  Dry your clothes in an automatic dryer rather than hanging them outside where they might collect pollen.  Rinse hair and eyes before bedtime.

## 2024-07-05 NOTE — Progress Notes (Signed)
 Follow Up Note  RE: Misty Curry MRN: [id number] DOB: [birth year] Date of Office Visit: 07/05/2024  Referring provider: [facility] Primary care provider: [facility]  Chief Complaint: Follow-up and Asthma (She says when she is moving fast, and changing weather that she feels wheezing. )  History of Present Illness: I had the pleasure of seeing Misty Curry for a follow up visit at the [facility] on 07/05/2024. She is a 33 y.o. female, who is being followed for asthma and allergic rhinitis. Her previous allergy office visit was on 01/02/2024 with Dr. Luke. Today is a regular follow up visit.  Discussed the use of AI scribe software for clinical note transcription with the patient, who gave verbal consent to proceed.    Her asthma symptoms have been fluctuating with the change of weather. She experiences shortness of breath and occasional wheezing, particularly during exertion, such as running down stairs from her third-floor apartment.  She uses Symbicort  80, taking two puffs twice a day, but missed her morning dose on the day of the visit. She occasionally uses her rescue inhaler, Albuterol , approximately twice last week, but has not required emergency care. She also takes Zyrtec  and uses Flonase  as needed for her allergies, which she finds sufficient.  No recent fevers, chills, or changes in eating habits.     Assessment and Plan: Misty Curry is a 33 y.o. female with: Moderate persistent asthma without complication Past history - Triggers include allergies, infections and heat. 2024 spirometry showed moderate obstruction with 40% improvement in FEV1 postbronchodilator treatment.  Clinically feeling much improved. Interim history - noted some symptoms with the weather change. Usually flares in the spring and fall. No prednisone .  Today's spirometry was normal but not as good as previous one. Daily controller  medication(s): Increase Symbicort  to 160mcg 2 puffs twice a day with spacer and rinse mouth afterwards for 1 month and then go back down to  Symbicort  80mcg 2 puffs twice a day as before.  During respiratory infections/flares:  Start Symbicort  160mcg 2 puffs twice a day for 1-2 weeks until your breathing symptoms return to baseline.  May use albuterol  rescue inhaler 2 puffs every 4 to 6 hours as needed for shortness of breath, chest tightness, coughing, and wheezing. May use albuterol  rescue inhaler 2 puffs 5 to 15 minutes prior to strenuous physical activities. Monitor frequency of use - if you need to use it more than twice per week on a consistent basis let us  know.  Get spirometry at next visit.   Other allergic rhinitis Past history - rhinoconjunctivitis symptoms mainly in the spring and summer.  Tried Zyrtec , Singulair and Flonase  with some benefit.  No prior allergy evaluation. Interim history - stable.  Use over the counter antihistamines such as Zyrtec  (cetirizine ), Claritin (loratadine), Allegra (fexofenadine), or Xyzal (levocetirizine) daily as needed. May take twice a day during allergy flares. May switch antihistamines every few months. Use Flonase  (fluticasone ) nasal spray 1-2 sprays per nostril once a day as needed for nasal congestion.  Nasal saline spray (i.e., Simply Saline) or nasal saline lavage (i.e., NeilMed) is recommended as needed and prior to medicated nasal sprays.  Return in about 6 months (around 01/02/2025).  Meds ordered this encounter  Medications   budesonide -formoterol  (SYMBICORT ) 160-4.5 MCG/ACT inhaler    Sig: Inhale 2 puffs into the lungs in the morning and at bedtime. with spacer and rinse mouth afterwards.    Dispense:  1 each  Refill:  5   Lab Orders  No laboratory test(s) ordered today    Diagnostics: Spirometry:  Tracings reviewed. Her effort: Good reproducible efforts. FVC: 3.88L FEV1: 2.37L, 86% predicted FEV1/FVC ratio:  61% Interpretation: Spirometry consistent with normal pattern.  Please see scanned spirometry results for details.  Results discussed with patient/family.   Medication List:  Current Outpatient Medications  Medication Sig Dispense Refill   Blood Pressure Monitoring (BLOOD PRESSURE KIT) DEVI 1 kit by Does not apply route once a week. Check Blood Pressure regularly and record readings into the [employer] App.  Large Cuff.  DX O90.0 1 each 0   budesonide -formoterol  (SYMBICORT ) 160-4.5 MCG/ACT inhaler Inhale 2 puffs into the lungs in the morning and at bedtime. with spacer and rinse mouth afterwards. 1 each 5   budesonide -formoterol  (SYMBICORT ) 80-4.5 MCG/ACT inhaler Inhale 2 puffs into the lungs in the morning and at bedtime. with spacer and rinse mouth afterwards. 1 each 5   cetirizine  (ZYRTEC  ALLERGY) 10 MG tablet Take 1 tablet (10 mg total) by mouth daily. 30 tablet 0   fluticasone  (FLONASE ) 50 MCG/ACT nasal spray Place 1-2 sprays into both nostrils daily as needed (nasal congestion). 16 g 5   VENTOLIN  HFA 108 (90 Base) MCG/ACT inhaler INHALE 2 PUFFS INTO THE LUNGS EVERY 4 (FOUR) HOURS AS NEEDED FOR WHEEZING OR SHORTNESS OF BREATH (COUGHING FITS). 18 each 1   ferrous sulfate  (FERROUSUL) 325 (65 FE) MG tablet Take 1 tablet (325 mg total) by mouth every other day. (Patient not taking: Reported on 07/05/2024) 60 tablet 1   No current facility-administered medications for this visit.   Allergies: Allergies  Allergen Reactions   Sertraline Other (See Comments)    somulence   I reviewed her past medical history, social history, family history, and environmental history and no significant changes have been reported from her previous visit.  Review of Systems  Constitutional:  Negative for appetite change, chills, fever and unexpected weight change.  HENT:  Negative for congestion and rhinorrhea.   Eyes:  Negative for itching.  Respiratory:  Positive for cough, chest tightness, shortness of  breath and wheezing.   Cardiovascular:  Negative for chest pain.  Gastrointestinal:  Negative for abdominal pain.  Genitourinary:  Negative for difficulty urinating.  Skin:  Negative for rash.  Neurological:  Negative for headaches.    Objective: BP 116/78 (BP Location: Left Arm, Patient Position: Sitting, Cuff Size: Normal)   Pulse 86   Temp 97.8 F (36.6 C) (Temporal)   Resp 16   Ht 5' 6.25 (1.683 m)   Wt 217 lb 3.2 oz (98.5 kg)   SpO2 98%   BMI 34.79 kg/m  Body mass index is 34.79 kg/m. Physical Exam Vitals and nursing note reviewed.  Constitutional:      Appearance: Normal appearance. She is well-developed.  HENT:     Head: Normocephalic and atraumatic.     Right Ear: Tympanic membrane and external ear normal.     Left Ear: Tympanic membrane and external ear normal.     Nose: Nose normal.     Mouth/Throat:     Mouth: Mucous membranes are moist.     Pharynx: Oropharynx is clear.  Eyes:     Conjunctiva/sclera: Conjunctivae normal.  Cardiovascular:     Rate and Rhythm: Normal rate and regular rhythm.     Heart sounds: Normal heart sounds. No murmur heard.    No friction rub. No gallop.  Pulmonary:     Effort: Pulmonary effort is normal.  Breath sounds: Normal breath sounds. No wheezing, rhonchi or rales.  Musculoskeletal:     Cervical back: Neck supple.  Skin:    General: Skin is warm.     Findings: No rash.  Neurological:     Mental Status: She is alert and oriented to person, place, and time.  Psychiatric:        Behavior: Behavior normal.    Previous notes and tests were reviewed. The plan was reviewed with the patient/family, and all questions/concerned were addressed.  It was my pleasure to see [place] today and participate in her care. Please feel free to contact me with any questions or concerns.  Sincerely,  Orlan Cramp, DO Allergy & Immunology  [facility]  [place] office: [phone] [facility] office:  [phone]

## 2024-07-29 ENCOUNTER — Other Ambulatory Visit: Payer: Self-pay | Admitting: Allergy

## 2024-09-29 ENCOUNTER — Other Ambulatory Visit: Payer: Self-pay | Admitting: Allergy

## 2025-01-03 ENCOUNTER — Ambulatory Visit: Admitting: Allergy
